# Patient Record
Sex: Female | Born: 1935 | Race: Black or African American | Hispanic: No | Smoking: Former smoker
Health system: Southern US, Community
[De-identification: ages and names within clinical notes are randomized; demographics above are authoritative.]

## PROBLEM LIST (undated history)

## (undated) DIAGNOSIS — C50919 Malignant neoplasm of unspecified site of unspecified female breast: Secondary | ICD-10-CM

## (undated) DIAGNOSIS — I82409 Acute embolism and thrombosis of unspecified deep veins of unspecified lower extremity: Secondary | ICD-10-CM

## (undated) DIAGNOSIS — I639 Cerebral infarction, unspecified: Secondary | ICD-10-CM

## (undated) DIAGNOSIS — Z5111 Encounter for antineoplastic chemotherapy: Secondary | ICD-10-CM

## (undated) HISTORY — PX: APPENDECTOMY: SHX54

---

## 2015-06-20 DIAGNOSIS — C50411 Malignant neoplasm of upper-outer quadrant of right female breast: Secondary | ICD-10-CM | POA: Insufficient documentation

## 2019-12-05 ENCOUNTER — Encounter (HOSPITAL_BASED_OUTPATIENT_CLINIC_OR_DEPARTMENT_OTHER): Payer: Self-pay | Admitting: Emergency Medicine

## 2019-12-05 ENCOUNTER — Other Ambulatory Visit: Payer: Self-pay

## 2019-12-05 ENCOUNTER — Emergency Department (HOSPITAL_BASED_OUTPATIENT_CLINIC_OR_DEPARTMENT_OTHER)
Admission: EM | Admit: 2019-12-05 | Discharge: 2019-12-05 | Disposition: A | Discharge status: Home / Self Care | Payer: Medicare Other | Attending: Emergency Medicine | Admitting: Emergency Medicine

## 2019-12-05 ENCOUNTER — Emergency Department (HOSPITAL_BASED_OUTPATIENT_CLINIC_OR_DEPARTMENT_OTHER): Payer: Medicare Other

## 2019-12-05 DIAGNOSIS — Z853 Personal history of malignant neoplasm of breast: Secondary | ICD-10-CM | POA: Insufficient documentation

## 2019-12-05 DIAGNOSIS — R55 Syncope and collapse: Secondary | ICD-10-CM | POA: Insufficient documentation

## 2019-12-05 HISTORY — DX: Encounter for antineoplastic chemotherapy: Z51.11

## 2019-12-05 HISTORY — DX: Malignant neoplasm of unspecified site of unspecified female breast: C50.919

## 2019-12-05 LAB — URINALYSIS, ROUTINE W REFLEX MICROSCOPIC
Bilirubin Urine: NEGATIVE
Glucose, UA: NEGATIVE mg/dL
Ketones, ur: NEGATIVE mg/dL
Leukocytes,Ua: NEGATIVE
Nitrite: NEGATIVE
Protein, ur: NEGATIVE mg/dL
Specific Gravity, Urine: 1.02 (ref 1.005–1.030)
pH: 6 (ref 5.0–8.0)

## 2019-12-05 LAB — BASIC METABOLIC PANEL
Anion gap: 8 (ref 5–15)
BUN: 13 mg/dL (ref 8–23)
CO2: 27 mmol/L (ref 22–32)
Calcium: 8.8 mg/dL — ABNORMAL LOW (ref 8.9–10.3)
Chloride: 105 mmol/L (ref 98–111)
Creatinine, Ser: 1.17 mg/dL — ABNORMAL HIGH (ref 0.44–1.00)
GFR calc Af Amer: 51 mL/min — ABNORMAL LOW (ref >60)
GFR calc non Af Amer: 44 mL/min — ABNORMAL LOW (ref >60)
Glucose, Bld: 133 mg/dL — ABNORMAL HIGH (ref 70–99)
Potassium: 3.8 mmol/L (ref 3.5–5.1)
Sodium: 140 mmol/L (ref 135–145)

## 2019-12-05 LAB — CBG MONITORING, ED: Glucose-Capillary: 124 mg/dL — ABNORMAL HIGH (ref 70–99)

## 2019-12-05 LAB — CBC
HCT: 41.2 % (ref 36.0–46.0)
Hemoglobin: 12.9 g/dL (ref 12.0–15.0)
MCH: 25.8 pg — ABNORMAL LOW (ref 26.0–34.0)
MCHC: 31.3 g/dL (ref 30.0–36.0)
MCV: 82.4 fL (ref 80.0–100.0)
Platelets: 283 10*3/uL (ref 150–400)
RBC: 5 MIL/uL (ref 3.87–5.11)
RDW: 13.4 % (ref 11.5–15.5)
WBC: 5.4 10*3/uL (ref 4.0–10.5)
nRBC: 0 % (ref 0.0–0.2)

## 2019-12-05 LAB — URINALYSIS, MICROSCOPIC (REFLEX)

## 2019-12-05 MED ORDER — SODIUM CHLORIDE 0.9 % IV BOLUS
1000.0000 mL | Freq: Once | INTRAVENOUS | Status: AC
  Administered 2019-12-05: 1000 mL via INTRAVENOUS

## 2019-12-05 MED ORDER — SODIUM CHLORIDE 0.9% FLUSH
3.0000 mL | Freq: Once | INTRAVENOUS | Status: DC
  Filled 2019-12-05: qty 3

## 2019-12-05 NOTE — Discharge Instructions (Signed)
Recommend scheduling a follow-up appointment with your primary doctor to discuss the symptoms you are experiencing today.  If you have any further episodes of passing out, any chest pain or difficulty in breathing, please return to ER for reassessment.

## 2019-12-05 NOTE — ED Triage Notes (Signed)
Pt c/o B/L hip, knee and back pain ongoing for awhile. Today pt took 1 oxycodone afterwards she was cooking in the kitchen and had a syncopal episode.

## 2019-12-05 NOTE — ED Provider Notes (Signed)
[place] EMERGENCY DEPARTMENT Provider Note   CSN: [id number] Arrival date & time: 12/05/19  1426     History Chief Complaint  Patient presents with  . Loss of Consciousness    Cynthia Ho is a 83 y.o. female.  Presents from the department after syncopal episode.  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  States she has not eaten or drink anything today and while she was in the kitchen cooking, felt lightheaded, then passed out.  States she landed on her left knee, denies any head trauma, no neck, back pain.  She has not had any associated chest pain, difficulty breathing, abdominal pain.  States she had full quick return consciousness, no bladder bowel incontinence, no shaking.    HPI     Past Medical History:  Diagnosis Date  . Admission for chemotherapy   . Breast cancer ([place])     There are no problems to display for this patient.   Past Surgical History:  Procedure Laterality Date  . APPENDECTOMY       OB History   No obstetric history on file.     No family history on file.  Social History   Tobacco Use  . Smoking status: Never Smoker  . Smokeless tobacco: Never Used  Substance Use Topics  . Alcohol use: Never  . Drug use: Never    Home Medications Prior to Admission medications   Not on File    Allergies    Patient has no known allergies.  Review of Systems   Review of Systems  Constitutional: Negative for chills and fever.  HENT: Negative for ear pain and sore throat.   Eyes: Negative for pain and visual disturbance.  Respiratory: Negative for cough and shortness of breath.   Cardiovascular: Negative for chest pain and palpitations.  Gastrointestinal: Negative for abdominal pain and vomiting.  Genitourinary: Negative for dysuria and  hematuria.  Musculoskeletal: Negative for arthralgias and back pain.  Skin: Negative for color change and rash.  Neurological: Positive for syncope. Negative for seizures.  All other systems reviewed and are negative.   Physical Exam Updated Vital Signs BP (!) 107/55   Pulse (!) 54   Temp 98.7 F (37.1 C) (Oral)   Resp 13   Ht 5\' 5"  (1.651 m)   Wt 77.1 kg   SpO2 98%   BMI 28.29 kg/m   Physical Exam Vitals and nursing note reviewed.  Constitutional:      General: She is not in acute distress.    Appearance: She is well-developed.  HENT:     Head: Normocephalic and atraumatic.  Eyes:     Conjunctiva/sclera: Conjunctivae normal.  Cardiovascular:     Rate and Rhythm: Normal rate and regular rhythm.     Heart sounds: No murmur.  Pulmonary:     Effort: Pulmonary effort is normal. No respiratory distress.     Breath sounds: Normal breath sounds.  Abdominal:     Palpations: Abdomen is soft.     Tenderness: There is no abdominal tenderness.  Musculoskeletal:        General: No swelling, tenderness or deformity.     Cervical back: Neck supple.     Comments: No C, T, L spine TTP No TTP throughout extremities, normal joint ROM  Skin:  General: Skin is warm and dry.     Capillary Refill: Capillary refill takes less than 2 seconds.  Neurological:     General: No focal deficit present.     Mental Status: She is alert and oriented to person, place, and time.  Psychiatric:        Mood and Affect: Mood normal.        Behavior: Behavior normal.     ED Results / Procedures / Treatments   Labs (all labs ordered are listed, but only abnormal results are displayed) Labs Reviewed  BASIC METABOLIC PANEL - Abnormal; Notable for the following components:      Result Value   Glucose, Bld 133 (*)    Creatinine, Ser 1.17 (*)    Calcium 8.8 (*)    GFR calc non Af Amer 44 (*)    GFR calc Af Amer 51 (*)    All other components within normal limits  CBC - Abnormal; Notable for the  following components:   MCH 25.8 (*)    All other components within normal limits  URINALYSIS, ROUTINE W REFLEX MICROSCOPIC - Abnormal; Notable for the following components:   Hgb urine dipstick TRACE (*)    All other components within normal limits  URINALYSIS, MICROSCOPIC (REFLEX) - Abnormal; Notable for the following components:   Bacteria, UA RARE (*)    All other components within normal limits  CBG MONITORING, ED - Abnormal; Notable for the following components:   Glucose-Capillary 124 (*)    All other components within normal limits    EKG EKG Interpretation  Date/Time:  Sunday December 05 2019 14:40:16 EST Ventricular Rate:  64 PR Interval:  144 QRS Duration: 100 QT Interval:  458 QTC Calculation: 472 R Axis:   30 Text Interpretation: Normal sinus rhythm Minimal voltage criteria for LVH, may be normal variant ( Cornell product ) Borderline ECG Confirmed by ,  ([id number]) on 12/05/2019 3:11:52 PM   Radiology DG Chest 2 View  Result Date: 12/05/2019 CLINICAL DATA:  Former smoker; no h/o lung problems; pt had syncopal episode after taking an oxycodone EXAM: CHEST - 2 VIEW COMPARISON:  None. FINDINGS: Cardiac silhouette is normal in size. No mediastinal or hilar masses. No evidence of adenopathy. Lungs are hyperexpanded but clear. No pleural effusion or pneumothorax. Skeletal structures are demineralized but intact. IMPRESSION: No acute cardiopulmonary disease. Electronically Signed   By: David  Ormond M.D.   On: 12/05/2019 16:12    Procedures Procedures (including critical care time)  Medications Ordered in ED Medications  sodium chloride 0.9 % bolus 1,000 mL (0 mLs Intravenous Stopped 12/05/19 1659)    ED Course  I have reviewed the triage vital signs and the nursing notes.  Pertinent labs & imaging results that were available during my care of the patient were reviewed by me and considered in my medical decision making (see chart for details).    MDM  Rules/Calculators/A&P                       83  year old lady presented to ER after syncopal episode.  Here noted to be well-appearing, unremarkable physical exam.  No associated trauma.  Had some preceding lightheadedness.  No report of seizure-like activity, no bladder bowel incontinence.  No preceding chest pain or dyspnea.  She has no ongoing complaints at that EKG without acute ischemic changes, CBC, BMP within normal limits.  Suspect most likely etiology vasovagal, also related to increase in her narcotic medication, dehydration.  Reviewed precautions, recommend recheck with PCP this coming week.  Will discharge home.    After the discussed management above, the patient was determined to be safe for discharge.  The patient was in agreement with this plan and all questions regarding their care were answered.  ED return precautions were discussed and the patient will return to the ED with any significant worsening of condition.   Final Clinical Impression(s) / ED Diagnoses Final diagnoses:  Syncope, unspecified syncope type    Rx / DC Orders ED Discharge Orders    None       Lucrezia Starch, MD 12/06/19 1341

## 2019-12-05 NOTE — ED Notes (Signed)
Pt on monitor 

## 2020-01-12 DIAGNOSIS — I2699 Other pulmonary embolism without acute cor pulmonale: Secondary | ICD-10-CM | POA: Insufficient documentation

## 2020-10-27 DIAGNOSIS — I6523 Occlusion and stenosis of bilateral carotid arteries: Secondary | ICD-10-CM | POA: Insufficient documentation

## 2020-10-27 DIAGNOSIS — I739 Peripheral vascular disease, unspecified: Secondary | ICD-10-CM | POA: Insufficient documentation

## 2021-09-20 DIAGNOSIS — F0390 Unspecified dementia without behavioral disturbance: Secondary | ICD-10-CM | POA: Insufficient documentation

## 2022-05-04 ENCOUNTER — Other Ambulatory Visit: Payer: Self-pay

## 2022-05-04 ENCOUNTER — Emergency Department (HOSPITAL_BASED_OUTPATIENT_CLINIC_OR_DEPARTMENT_OTHER): Payer: Medicare Other

## 2022-05-04 ENCOUNTER — Emergency Department (HOSPITAL_BASED_OUTPATIENT_CLINIC_OR_DEPARTMENT_OTHER)
Admission: EM | Admit: 2022-05-04 | Discharge: 2022-05-04 | Disposition: A | Discharge status: Home / Self Care | Payer: Medicare Other | Attending: Emergency Medicine | Admitting: Emergency Medicine

## 2022-05-04 ENCOUNTER — Encounter (HOSPITAL_BASED_OUTPATIENT_CLINIC_OR_DEPARTMENT_OTHER): Payer: Self-pay | Admitting: Emergency Medicine

## 2022-05-04 DIAGNOSIS — R0601 Orthopnea: Secondary | ICD-10-CM | POA: Diagnosis not present

## 2022-05-04 DIAGNOSIS — R059 Cough, unspecified: Secondary | ICD-10-CM | POA: Diagnosis not present

## 2022-05-04 DIAGNOSIS — D649 Anemia, unspecified: Secondary | ICD-10-CM | POA: Diagnosis not present

## 2022-05-04 DIAGNOSIS — Z853 Personal history of malignant neoplasm of breast: Secondary | ICD-10-CM | POA: Diagnosis not present

## 2022-05-04 DIAGNOSIS — I10 Essential (primary) hypertension: Secondary | ICD-10-CM | POA: Insufficient documentation

## 2022-05-04 DIAGNOSIS — Z20822 Contact with and (suspected) exposure to covid-19: Secondary | ICD-10-CM | POA: Insufficient documentation

## 2022-05-04 DIAGNOSIS — R600 Localized edema: Secondary | ICD-10-CM | POA: Diagnosis not present

## 2022-05-04 DIAGNOSIS — M7989 Other specified soft tissue disorders: Secondary | ICD-10-CM | POA: Diagnosis present

## 2022-05-04 HISTORY — DX: Cerebral infarction, unspecified: I63.9

## 2022-05-04 HISTORY — DX: Acute embolism and thrombosis of unspecified deep veins of unspecified lower extremity: I82.409

## 2022-05-04 LAB — CBC WITH DIFFERENTIAL/PLATELET
Abs Immature Granulocytes: 0.01 10*3/uL (ref 0.00–0.07)
Basophils Absolute: 0 10*3/uL (ref 0.0–0.1)
Basophils Relative: 1 %
Eosinophils Absolute: 0.2 10*3/uL (ref 0.0–0.5)
Eosinophils Relative: 4 %
HCT: 32 % — ABNORMAL LOW (ref 36.0–46.0)
Hemoglobin: 9.2 g/dL — ABNORMAL LOW (ref 12.0–15.0)
Immature Granulocytes: 0 %
Lymphocytes Relative: 29 %
Lymphs Abs: 1.5 10*3/uL (ref 0.7–4.0)
MCH: 19.4 pg — ABNORMAL LOW (ref 26.0–34.0)
MCHC: 28.8 g/dL — ABNORMAL LOW (ref 30.0–36.0)
MCV: 67.4 fL — ABNORMAL LOW (ref 80.0–100.0)
Monocytes Absolute: 0.8 10*3/uL (ref 0.1–1.0)
Monocytes Relative: 15 %
Neutro Abs: 2.7 10*3/uL (ref 1.7–7.7)
Neutrophils Relative %: 51 %
Platelets: 381 10*3/uL (ref 150–400)
RBC: 4.75 MIL/uL (ref 3.87–5.11)
RDW: 19.5 % — ABNORMAL HIGH (ref 11.5–15.5)
Smear Review: NORMAL
WBC: 5.3 10*3/uL (ref 4.0–10.5)
nRBC: 0 % (ref 0.0–0.2)

## 2022-05-04 LAB — RESP PANEL BY RT-PCR (FLU A&B, COVID) ARPGX2
Influenza A by PCR: NEGATIVE
Influenza B by PCR: NEGATIVE
SARS Coronavirus 2 by RT PCR: NEGATIVE

## 2022-05-04 LAB — COMPREHENSIVE METABOLIC PANEL
ALT: 12 U/L (ref 0–44)
AST: 16 U/L (ref 15–41)
Albumin: 3.2 g/dL — ABNORMAL LOW (ref 3.5–5.0)
Alkaline Phosphatase: 63 U/L (ref 38–126)
Anion gap: 8 (ref 5–15)
BUN: 18 mg/dL (ref 8–23)
CO2: 27 mmol/L (ref 22–32)
Calcium: 8.8 mg/dL — ABNORMAL LOW (ref 8.9–10.3)
Chloride: 105 mmol/L (ref 98–111)
Creatinine, Ser: 0.91 mg/dL (ref 0.44–1.00)
GFR, Estimated: >60 mL/min (ref >60)
Glucose, Bld: 111 mg/dL — ABNORMAL HIGH (ref 70–99)
Potassium: 3.5 mmol/L (ref 3.5–5.1)
Sodium: 140 mmol/L (ref 135–145)
Total Bilirubin: 0.6 mg/dL (ref 0.3–1.2)
Total Protein: 6.1 g/dL — ABNORMAL LOW (ref 6.5–8.1)

## 2022-05-04 LAB — TROPONIN I (HIGH SENSITIVITY): Troponin I (High Sensitivity): 11 ng/L (ref <18)

## 2022-05-04 LAB — BRAIN NATRIURETIC PEPTIDE: B Natriuretic Peptide: 42 pg/mL (ref 0.0–100.0)

## 2022-05-04 LAB — D-DIMER, QUANTITATIVE: D-Dimer, Quant: 0.34 ug/mL-FEU (ref 0.00–0.50)

## 2022-05-04 NOTE — ED Provider Notes (Addendum)
?[place] EMERGENCY DEPARTMENT ?Provider Note ? ? ?CSN: [id number] ?Arrival date & time: 05/04/22  0715 ? ?  ? ?History ? ?Chief Complaint  ?Patient presents with  ?? Leg Swelling  ? ? ?Cynthia Ho is a 86 y.o. female. ? ?HPI ? ?  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 ? ?The patient states that she has been taking her Eliquis.  She states that over the last 2 weeks she has noted increasing bilateral lower extremity edema, left greater than right.  She was started on Lasix and has been taking the medication but feels that her symptoms have not improved.  She endorses some dyspnea on exertion, orthopnea, paroxysmal nocturnal dyspnea.  She denies any chest pain. She has had a dry cough. No fevers or chills. No melena or hematochezia. ? ?Home Medications ?Prior to Admission medications   ?Not on File  ?   ? ?Allergies    ?Patient has no known allergies.   ? ?Review of Systems   ?Review of Systems  ?All other systems reviewed and are negative. ? ?Physical Exam ?Updated Vital Signs ?BP 130/61 (BP Location: Right Arm)   Pulse 72   Temp 97.7 ?F (36.5 ?C) (Oral)   Resp 16   Ht '5\' 5"'$  (1.651 m)   Wt 68 kg   SpO2 100%   BMI 24.96 kg/m?  ?Physical Exam ?Vitals and nursing note reviewed.  ?Constitutional:   ?   General: She is not in acute distress. ?   Appearance: She is well-developed.  ?HENT:  ?   Head: Normocephalic and atraumatic.  ?Eyes:  ?   Conjunctiva/sclera: Conjunctivae normal.  ?Cardiovascular:  ?   Rate and Rhythm: Normal rate and regular rhythm.  ?   Heart sounds: No murmur heard. ?Pulmonary:  ?   Effort: Pulmonary  effort is normal. No respiratory distress.  ?   Breath sounds: Normal breath sounds.  ?Abdominal:  ?   Palpations: Abdomen is soft.  ?   Tenderness: There is no abdominal tenderness.  ?Musculoskeletal:  ?   Cervical back: Neck supple.  ?   Comments: Trace bilateral pitting edema, left greater than right  ?Skin: ?   General: Skin is warm and dry.  ?   Capillary Refill: Capillary refill takes less than 2 seconds.  ?Neurological:  ?   Mental Status: She is alert.  ?Psychiatric:     ?   Mood and Affect: Mood normal.  ? ? ?ED Results / Procedures / Treatments   ?Labs ?(all labs ordered are listed, but only abnormal results are displayed) ?Labs Reviewed  ?COMPREHENSIVE METABOLIC PANEL - Abnormal; Notable for the following components:  ?    Result Value  ? Glucose, Bld 111 (*)   ? Calcium 8.8 (*)   ? Total Protein 6.1 (*)   ? Albumin 3.2 (*)   ? All other components within normal limits  ?CBC WITH DIFFERENTIAL/PLATELET - Abnormal; Notable for the following components:  ? Hemoglobin 9.2 (*)   ? HCT 32.0 (*)   ? MCV 67.4 (*)   ? MCH 19.4 (*)   ? MCHC 28.8 (*)   ? RDW 19.5 (*)   ?  All other components within normal limits  ?RESP PANEL BY RT-PCR (FLU A&B, COVID) ARPGX2  ?D-DIMER, QUANTITATIVE  ?BRAIN NATRIURETIC PEPTIDE  ?TROPONIN I (HIGH SENSITIVITY)  ? ? ?EKG ?EKG Interpretation ? ?Date/Time:  Saturday May 04 2022 07:55:12 EDT ?Ventricular Rate:  78 ?PR Interval:  157 ?QRS Duration: 113 ?QT Interval:  438 ?QTC Calculation: 499 ?R Axis:   17 ?Text Interpretation: Sinus arrhythmia Probable anteroseptal infarct, old Confirmed by Regan Lemming ([id number]) on 05/04/2022 8:05:27 AM ? ?Radiology ?US Venous Img Lower Bilateral (DVT) ? ?Result Date: 05/04/2022 ?CLINICAL DATA:  Bilateral leg swelling for 2 weeks. EXAM: BILATERAL LOWER EXTREMITY VENOUS DOPPLER ULTRASOUND TECHNIQUE: Gray-scale sonography with compression, as well as color and duplex ultrasound, were performed to evaluate the deep venous system(s) from the level of the common  femoral vein through the popliteal and proximal calf veins. COMPARISON:  None Available. FINDINGS: VENOUS Normal compressibility of the common femoral, superficial femoral, and popliteal veins, as well as the visualized calf veins. Visualized portions of profunda femoral vein and great saphenous vein unremarkable. No filling defects to suggest DVT on grayscale or color Doppler imaging. Doppler waveforms show normal direction of venous flow, normal respiratory plasticity and response to augmentation. Limited views of the contralateral common femoral vein are unremarkable. OTHER None. Limitations: none IMPRESSION: Negative. Electronically Signed   By: Kerby Moors M.D.   On: 05/04/2022 09:39  ? ?DG Chest Port 1 View ? ?Result Date: 05/04/2022 ?CLINICAL DATA:  Shortness of breath EXAM: PORTABLE CHEST 1 VIEW COMPARISON:  12/05/2019 FINDINGS: Eventration of the right diaphragm. There is no edema, consolidation, effusion, or pneumothorax. Normal heart size and mediastinal contours. IMPRESSION: No active disease. Electronically Signed   By: Jorje Guild M.D.   On: 05/04/2022 08:13   ? ?Procedures ?Procedures  ? ? ?Medications Ordered in ED ?Medications - No data to display ? ?ED Course/ Medical Decision Making/ A&P ?Clinical Course as of 05/04/22 1216  ?Sat May 04, 2022  ?1215 MCV(!): 67.4 [JL]  ?1215 Hemoglobin(!): 9.2 [JL]  ?  ?Clinical Course User Index ?[JL] Regan Lemming, MD  ? ?                        ?Medical Decision Making ?Amount and/or Complexity of Data Reviewed ?Labs: ordered. ?Radiology: ordered. ? ? ?  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 ? ?The patient states that she has been taking her Eliquis.  She states that over the last 2 weeks she has noted increasing bilateral lower extremity edema, left greater than right.  She was started on Lasix and has been taking the medication but feels that her symptoms have not improved.  She endorses some dyspnea on exertion, orthopnea, paroxysmal nocturnal dyspnea.  She denies any chest pain. She has had a dry cough. No fevers or chills. No melena or hematochezia. ? ? ?On arrival, the patient was afebrile, hemodynamically stable, mildly hypertensive BP 141/78, saturating 100% on room air.  Sinus rhythm noted on cardiac telemetry.  Patient presenting with shortness of breath and bilateral lower extremity swelling despite outpatient Lasix use with her PCP.  She has had  a dry cough. ? ?Differential diagnosis includes PE, COVID-19, viral URI, CHF, DVT, PNA, anemia. ? ?EKG significant for sinus arrhythmia, ventricular rate 78, PR interval 157, QRS 113, QTc 499, no acute ST segment changes to indicate ischemia. ? ?Chest x-ray was performed and was interpreted by myself and radiology and revealed no focal cardiac or pulmonary abnormality.  No evidence of pulmonary edema.  Laboratory work-up significant for COVID-19 and influenza PCR testing negative, BNP negative, troponin negative, CBC without a leukocytosis, new anemia compared to measurements over 2 years ago to 9.2 from a hemoglobin of greater than 12.  Doubt acute GI bleed at this time given lack of symptoms.  Patient could be experiencing mild symptomatic anemia with a hemoglobin of 9.2 but no indication for blood transfusion or admission at this time.  CMP was without significant electrolyte abnormality, decreased total protein and albumin with resultant mild hypercalcemia with normal calcium after correction for hypoalbuminemia.  Normal LFTs.  A D-dimer was also collected and resulted negative. ? ?DVT ultrasound imaging was performed and was  negative for acute abnormality with no evidence for DVT.  Patient has trace pitting edema.  She is already been prescribed Lasix and has been taking the medication.  With a negative D-dimer, low concern for acute PE.

## 2022-05-04 NOTE — ED Triage Notes (Signed)
Per family pt has had blateral leg swelling x 2 weeks , saw heer dr and was given lasix but her feet are till swelling , have been elevating them but no better ?

## 2022-05-04 NOTE — Discharge Instructions (Addendum)
Your laboratory work-up, EKG and chest x-ray imaging was reassuring.  Your ultrasound was also negative.  Make sure to wear compression stockings bilaterally, continue to take your prescribed lasix, continue to take your Eliquis, and follow up with your PCP.  ?

## 2022-07-14 ENCOUNTER — Emergency Department (HOSPITAL_BASED_OUTPATIENT_CLINIC_OR_DEPARTMENT_OTHER)
Admission: EM | Admit: 2022-07-14 | Discharge: 2022-07-14 | Disposition: A | Discharge status: Home / Self Care | Payer: Medicare Other | Attending: Emergency Medicine | Admitting: Emergency Medicine

## 2022-07-14 ENCOUNTER — Encounter (HOSPITAL_BASED_OUTPATIENT_CLINIC_OR_DEPARTMENT_OTHER): Payer: Self-pay | Admitting: Emergency Medicine

## 2022-07-14 ENCOUNTER — Telehealth: Payer: Self-pay | Admitting: Surgery

## 2022-07-14 ENCOUNTER — Emergency Department (HOSPITAL_BASED_OUTPATIENT_CLINIC_OR_DEPARTMENT_OTHER): Payer: Medicare Other

## 2022-07-14 DIAGNOSIS — W1839XA Other fall on same level, initial encounter: Secondary | ICD-10-CM | POA: Insufficient documentation

## 2022-07-14 DIAGNOSIS — N3000 Acute cystitis without hematuria: Secondary | ICD-10-CM

## 2022-07-14 DIAGNOSIS — D649 Anemia, unspecified: Secondary | ICD-10-CM | POA: Diagnosis not present

## 2022-07-14 DIAGNOSIS — M545 Low back pain, unspecified: Secondary | ICD-10-CM | POA: Diagnosis present

## 2022-07-14 DIAGNOSIS — W19XXXA Unspecified fall, initial encounter: Secondary | ICD-10-CM

## 2022-07-14 LAB — BASIC METABOLIC PANEL
Anion gap: 8 (ref 5–15)
BUN: 17 mg/dL (ref 8–23)
CO2: 27 mmol/L (ref 22–32)
Calcium: 8.8 mg/dL — ABNORMAL LOW (ref 8.9–10.3)
Chloride: 104 mmol/L (ref 98–111)
Creatinine, Ser: 1.09 mg/dL — ABNORMAL HIGH (ref 0.44–1.00)
GFR, Estimated: 50 mL/min — ABNORMAL LOW (ref >60)
Glucose, Bld: 116 mg/dL — ABNORMAL HIGH (ref 70–99)
Potassium: 3.7 mmol/L (ref 3.5–5.1)
Sodium: 139 mmol/L (ref 135–145)

## 2022-07-14 LAB — URINALYSIS, MICROSCOPIC (REFLEX): WBC, UA: >50 WBC/hpf (ref 0–5)

## 2022-07-14 LAB — CBC WITH DIFFERENTIAL/PLATELET
Abs Immature Granulocytes: 0.02 10*3/uL (ref 0.00–0.07)
Basophils Absolute: 0 10*3/uL (ref 0.0–0.1)
Basophils Relative: 0 %
Eosinophils Absolute: 0.1 10*3/uL (ref 0.0–0.5)
Eosinophils Relative: 1 %
HCT: 32.6 % — ABNORMAL LOW (ref 36.0–46.0)
Hemoglobin: 9.6 g/dL — ABNORMAL LOW (ref 12.0–15.0)
Immature Granulocytes: 0 %
Lymphocytes Relative: 8 %
Lymphs Abs: 0.5 10*3/uL — ABNORMAL LOW (ref 0.7–4.0)
MCH: 19.7 pg — ABNORMAL LOW (ref 26.0–34.0)
MCHC: 29.4 g/dL — ABNORMAL LOW (ref 30.0–36.0)
MCV: 66.8 fL — ABNORMAL LOW (ref 80.0–100.0)
Monocytes Absolute: 0.4 10*3/uL (ref 0.1–1.0)
Monocytes Relative: 5 %
Neutro Abs: 5.9 10*3/uL (ref 1.7–7.7)
Neutrophils Relative %: 86 %
Platelets: 471 10*3/uL — ABNORMAL HIGH (ref 150–400)
RBC: 4.88 MIL/uL (ref 3.87–5.11)
RDW: 19.9 % — ABNORMAL HIGH (ref 11.5–15.5)
WBC: 6.9 10*3/uL (ref 4.0–10.5)
nRBC: 0 % (ref 0.0–0.2)

## 2022-07-14 LAB — URINALYSIS, ROUTINE W REFLEX MICROSCOPIC
Bilirubin Urine: NEGATIVE
Glucose, UA: NEGATIVE mg/dL
Ketones, ur: NEGATIVE mg/dL
Nitrite: POSITIVE — AB
Protein, ur: NEGATIVE mg/dL
Specific Gravity, Urine: 1.02 (ref 1.005–1.030)
pH: 6 (ref 5.0–8.0)

## 2022-07-14 MED ORDER — OXYCODONE-ACETAMINOPHEN 5-325 MG PO TABS
1.0000 | ORAL_TABLET | Freq: Once | ORAL | Status: AC
  Administered 2022-07-14: 1 via ORAL
  Filled 2022-07-14: qty 1

## 2022-07-14 MED ORDER — NITROFURANTOIN MONOHYD MACRO 100 MG PO CAPS: 100.0000 mg | ORAL_CAPSULE | Freq: Two times a day (BID) | ORAL | 0 refills | Status: AC

## 2022-07-14 MED ORDER — METHOCARBAMOL 500 MG PO TABS
500.0000 mg | ORAL_TABLET | Freq: Once | ORAL | Status: AC
  Administered 2022-07-14: 500 mg via ORAL
  Filled 2022-07-14: qty 1

## 2022-07-14 MED ORDER — DICLOFENAC SODIUM 1 % EX GEL: 4.0000 g | Freq: Four times a day (QID) | CUTANEOUS | 0 refills | Status: AC | PRN

## 2022-07-14 NOTE — ED Provider Notes (Signed)
[place] EMERGENCY DEPARTMENT Provider Note   CSN: [id number] Arrival date & time: 07/14/22  1620     History PMHx significant for hx of DVT on eliquis Chief Complaint  Patient presents with   Fall   Back Pain    Cynthia Ho is a 86 y.o. female.  Pt states she was going to the bath room around 5:30 am and fell backwards. Did not trip, just felt shaky which is not unusual for her. She has a walker but was not using it. She doesn't think she hit her head and denies LOC, light headedness. She states she fell on her back. Since she fell she has had pain in her lower back, worse with movement. She took Tylenol this morning which did not help. She takes Eliquis d/t hx of blood clots.    Fall Associated symptoms include shortness of breath. Pertinent negatives include no chest pain and no abdominal pain.  Back Pain Associated symptoms: no abdominal pain, no chest pain and no dysuria        Home Medications Prior to Admission medications   Medication Sig Start Date End Date Taking? Authorizing Provider  diclofenac Sodium (VOLTAREN) 1 % GEL Apply 4 g topically 4 (four) times daily as needed. 07/14/22  Yes Precious Gilding, DO  nitrofurantoin, macrocrystal-monohydrate, (MACROBID) 100 MG capsule Take 1 capsule (100 mg total) by mouth 2 (two) times daily for 7 days. 07/14/22 07/21/22 Yes Precious Gilding, DO      Allergies    Patient has no known allergies.    Review of Systems   Review of Systems  Constitutional:  Negative for fatigue.  Respiratory:  Positive for shortness of breath.   Cardiovascular:  Negative for chest pain.  Gastrointestinal:  Positive for nausea. Negative for abdominal pain, blood in stool and vomiting.  Genitourinary:  Positive for frequency. Negative for dysuria.  Musculoskeletal:  Positive for back pain.  Neurological:  Negative for dizziness and light-headedness.    Physical Exam Updated Vital Signs BP (!) 155/70   Pulse 60   Temp 97.8 F  (36.6 C) (Oral)   Resp 16   Ht '5\' 5"'$  (1.651 m)   Wt 68 kg   SpO2 95%   BMI 24.96 kg/m  Physical Exam Constitutional:      General: She is not in acute distress.    Appearance: She is not ill-appearing.  Eyes:     Conjunctiva/sclera: Conjunctivae normal.  Cardiovascular:     Rate and Rhythm: Normal rate and regular rhythm.  Pulmonary:     Effort: Pulmonary effort is normal.     Breath sounds: Normal breath sounds.  Abdominal:     General: Abdomen is flat. Bowel sounds are normal. There is no distension.     Palpations: Abdomen is soft.  Musculoskeletal:     Thoracic back: No swelling or tenderness.     Lumbar back: Tenderness present. No swelling.     Comments: Tenderness at L4-L5  Skin:    General: Skin is warm and dry.  Neurological:     General: No focal deficit present.     Mental Status: She is alert.  Psychiatric:        Mood and Affect: Mood normal.        Behavior: Behavior normal.     ED Results / Procedures / Treatments   Labs (all labs ordered are listed, but only abnormal results are displayed) Labs Reviewed  CBC WITH DIFFERENTIAL/PLATELET - Abnormal; Notable for the following components:  Result Value   Hemoglobin 9.6 (*)    HCT 32.6 (*)    MCV 66.8 (*)    MCH 19.7 (*)    MCHC 29.4 (*)    RDW 19.9 (*)    Platelets 471 (*)    Lymphs Abs 0.5 (*)    All other components within normal limits  BASIC METABOLIC PANEL - Abnormal; Notable for the following components:   Glucose, Bld 116 (*)    Creatinine, Ser 1.09 (*)    Calcium 8.8 (*)    GFR, Estimated 50 (*)    All other components within normal limits  URINALYSIS, ROUTINE W REFLEX MICROSCOPIC - Abnormal; Notable for the following components:   APPearance CLOUDY (*)    Hgb urine dipstick SMALL (*)    Nitrite POSITIVE (*)    Leukocytes,Ua LARGE (*)    All other components within normal limits  URINALYSIS, MICROSCOPIC (REFLEX) - Abnormal; Notable for the following components:   Bacteria, UA  MANY (*)    All other components within normal limits    EKG None  Radiology DG Lumbar Spine Complete  Result Date: 07/14/2022 CLINICAL DATA:  Lower back pain after fall. EXAM: LUMBAR SPINE - COMPLETE 4+ VIEW COMPARISON:  Lumbar spine radiographs 05/31/2019; CT chest 05/28/2022 FINDINGS: There are 5 non-rib-bearing lumbar-type vertebral bodies. Minimal levocurvature centered at L3-4. 4 mm grade 1 anterolisthesis of L4 on L5. Minimal superior L2 endplate concavity is mildly worsened from 05/31/2019 lumbar spine radiographs. Unfortunately the available 05/28/2022 CT only minimally images a portion of the superior L2 vertebral endplate. This compression fracture remains age indeterminate. Mild anterior L1-2 endplate osteophytes. Minimal posterior L5-S1 disc space narrowing. Moderate atherosclerotic calcifications within the aorta and iliac arteries. IMPRESSION: 1. Mild superior L2 endplate compression fracture is mildly worsened from 05/31/2019. No more recent imaging is available to further assess acuity. This remains age indeterminate. Recommend clinical correlation for point tenderness. 2. Mild grade 1 anterolisthesis of L4 on L5 is unchanged. Electronically Signed   By: Yvonne Kendall M.D.   On: 07/14/2022 17:40    Procedures none  Medications Ordered in ED Medications  oxyCODONE-acetaminophen (PERCOCET/ROXICET) 5-325 MG per tablet 1 tablet (1 tablet Oral Given 07/14/22 1721)  methocarbamol (ROBAXIN) tablet 500 mg (500 mg Oral Given 07/14/22 1926)    ED Course/ Medical Decision Making/ A&P                           Medical Decision Making 86 y.o. female presents after fall onto back likely d/t not using her walker. Did not hit head. VSS. She has pain with palpation of lumbar spine. Lumbar X ray shows age-indeterminate compression fracture of L2 which is likely unrelated to the pain she is currently having as her pain is in the L4-L5 region. She was given Percocet 5-325 mg and Robaxin 500 mg  once for pain.  CBC, CMP were obtained to rule out other causes of her fall including worsening anemia, electrolyte imbalances, metabolic disturbances which were unremarkable.  CBC did show microcytic anemia but this appears to be chronic and stable.  UA did show large leuks, nitrites, bacteria.  Considering this with patient's increased urinary frequency, a prescription Macrobid was sent to her pharmacy.  For pain, a prescription of Voltaren gel was sent to pharmacy and she was also advised to use over-the-counter Tylenol as needed.  There is no indication for admission and patient was discharged in stable condition with instructions to follow-up  with her primary care doctor within 1 week of discharge.  Amount and/or Complexity of Data Reviewed Labs: ordered. Radiology: ordered.  Risk Prescription drug management.  None  Final Clinical Impression(s) / ED Diagnoses Final diagnoses:  Acute midline low back pain without sciatica  Fall, initial encounter  Acute cystitis without hematuria    Rx / DC Orders ED Discharge Orders          Ordered    nitrofurantoin, macrocrystal-monohydrate, (MACROBID) 100 MG capsule  2 times daily        07/14/22 1938    diclofenac Sodium (VOLTAREN) 1 % GEL  4 times daily PRN        07/14/22 1939              Precious Gilding, DO 07/14/22 1954    Lucrezia Starch, MD 07/15/22 2202

## 2022-07-14 NOTE — Discharge Instructions (Signed)
Thank you for allowing [place] to care for you today.  For your back pain, a prescription of Voltaren gel has been sent to your pharmacy.  I have also sent a prescription for an antibiotic called Macrobid for a urinary tract infection.  Please take this antibiotic to completion.  I recommend following up with your primary care doctor within 1 week of discharge for a follow-up visit and also to discuss risks and benefits of staying on your blood thinner considering your risk of falls.

## 2022-07-14 NOTE — Telephone Encounter (Signed)
ED RNCM attempted to contact daughter Casimer Bilis to discuss the criteria for SNF placement no answer TOC will made second attempt tomorrow.

## 2022-07-14 NOTE — ED Notes (Signed)
Pt transported to radiology.

## 2022-07-14 NOTE — ED Triage Notes (Signed)
Mechanical fall this morning 530 am.  C/o lower back pain.  Unsure if she hit her head. Denies neck pain, head pain, vision changes.  + blood thinners (Eliquis) Progressive weakness BLLE. Prone to falls. Recently in rehab for this. Pupils equal, round, reactive.

## 2022-07-15 ENCOUNTER — Telehealth: Payer: Self-pay | Admitting: Surgery

## 2022-07-17 ENCOUNTER — Emergency Department (HOSPITAL_COMMUNITY): Payer: Medicare Other

## 2022-07-17 ENCOUNTER — Telehealth: Payer: Self-pay

## 2022-07-17 ENCOUNTER — Other Ambulatory Visit: Payer: Self-pay

## 2022-07-17 ENCOUNTER — Observation Stay (HOSPITAL_COMMUNITY)
Admission: EM | Admit: 2022-07-17 | Discharge: 2022-07-19 | Disposition: A | Discharge status: Skilled Nursing Facility | Payer: Medicare Other | Attending: Internal Medicine | Admitting: Internal Medicine

## 2022-07-17 DIAGNOSIS — Z79899 Other long term (current) drug therapy: Secondary | ICD-10-CM | POA: Insufficient documentation

## 2022-07-17 DIAGNOSIS — E876 Hypokalemia: Secondary | ICD-10-CM

## 2022-07-17 DIAGNOSIS — S32028A Other fracture of second lumbar vertebra, initial encounter for closed fracture: Secondary | ICD-10-CM | POA: Diagnosis not present

## 2022-07-17 DIAGNOSIS — S32020A Wedge compression fracture of second lumbar vertebra, initial encounter for closed fracture: Secondary | ICD-10-CM | POA: Diagnosis not present

## 2022-07-17 DIAGNOSIS — Z8673 Personal history of transient ischemic attack (TIA), and cerebral infarction without residual deficits: Secondary | ICD-10-CM | POA: Diagnosis not present

## 2022-07-17 DIAGNOSIS — Z86711 Personal history of pulmonary embolism: Secondary | ICD-10-CM | POA: Insufficient documentation

## 2022-07-17 DIAGNOSIS — E039 Hypothyroidism, unspecified: Secondary | ICD-10-CM | POA: Diagnosis not present

## 2022-07-17 DIAGNOSIS — Z853 Personal history of malignant neoplasm of breast: Secondary | ICD-10-CM | POA: Insufficient documentation

## 2022-07-17 DIAGNOSIS — S32000A Wedge compression fracture of unspecified lumbar vertebra, initial encounter for closed fracture: Secondary | ICD-10-CM | POA: Diagnosis present

## 2022-07-17 DIAGNOSIS — Z87891 Personal history of nicotine dependence: Secondary | ICD-10-CM | POA: Diagnosis not present

## 2022-07-17 DIAGNOSIS — D509 Iron deficiency anemia, unspecified: Secondary | ICD-10-CM | POA: Insufficient documentation

## 2022-07-17 DIAGNOSIS — Y92009 Unspecified place in unspecified non-institutional (private) residence as the place of occurrence of the external cause: Secondary | ICD-10-CM | POA: Diagnosis not present

## 2022-07-17 DIAGNOSIS — Z86718 Personal history of other venous thrombosis and embolism: Secondary | ICD-10-CM | POA: Insufficient documentation

## 2022-07-17 DIAGNOSIS — R2681 Unsteadiness on feet: Secondary | ICD-10-CM | POA: Insufficient documentation

## 2022-07-17 DIAGNOSIS — D649 Anemia, unspecified: Secondary | ICD-10-CM

## 2022-07-17 DIAGNOSIS — M6281 Muscle weakness (generalized): Secondary | ICD-10-CM | POA: Insufficient documentation

## 2022-07-17 DIAGNOSIS — W1839XA Other fall on same level, initial encounter: Secondary | ICD-10-CM | POA: Diagnosis not present

## 2022-07-17 DIAGNOSIS — R109 Unspecified abdominal pain: Secondary | ICD-10-CM | POA: Diagnosis not present

## 2022-07-17 DIAGNOSIS — D75839 Thrombocytosis, unspecified: Secondary | ICD-10-CM | POA: Diagnosis not present

## 2022-07-17 DIAGNOSIS — N390 Urinary tract infection, site not specified: Secondary | ICD-10-CM

## 2022-07-17 DIAGNOSIS — M545 Low back pain, unspecified: Secondary | ICD-10-CM | POA: Diagnosis present

## 2022-07-17 DIAGNOSIS — Z7982 Long term (current) use of aspirin: Secondary | ICD-10-CM | POA: Diagnosis not present

## 2022-07-17 LAB — CBC WITH DIFFERENTIAL/PLATELET
Abs Immature Granulocytes: 0.02 10*3/uL (ref 0.00–0.07)
Basophils Absolute: 0 10*3/uL (ref 0.0–0.1)
Basophils Relative: 1 %
Eosinophils Absolute: 0.2 10*3/uL (ref 0.0–0.5)
Eosinophils Relative: 3 %
HCT: 34.3 % — ABNORMAL LOW (ref 36.0–46.0)
Hemoglobin: 9.7 g/dL — ABNORMAL LOW (ref 12.0–15.0)
Immature Granulocytes: 0 %
Lymphocytes Relative: 20 %
Lymphs Abs: 1.3 10*3/uL (ref 0.7–4.0)
MCH: 19.4 pg — ABNORMAL LOW (ref 26.0–34.0)
MCHC: 28.3 g/dL — ABNORMAL LOW (ref 30.0–36.0)
MCV: 68.5 fL — ABNORMAL LOW (ref 80.0–100.0)
Monocytes Absolute: 1 10*3/uL (ref 0.1–1.0)
Monocytes Relative: 15 %
Neutro Abs: 3.9 10*3/uL (ref 1.7–7.7)
Neutrophils Relative %: 61 %
Platelets: 520 10*3/uL — ABNORMAL HIGH (ref 150–400)
RBC: 5.01 MIL/uL (ref 3.87–5.11)
RDW: 21.2 % — ABNORMAL HIGH (ref 11.5–15.5)
WBC: 6.4 10*3/uL (ref 4.0–10.5)
nRBC: 0 % (ref 0.0–0.2)

## 2022-07-17 LAB — COMPREHENSIVE METABOLIC PANEL
ALT: 12 U/L (ref 0–44)
AST: 17 U/L (ref 15–41)
Albumin: 3.3 g/dL — ABNORMAL LOW (ref 3.5–5.0)
Alkaline Phosphatase: 57 U/L (ref 38–126)
Anion gap: 7 (ref 5–15)
BUN: 19 mg/dL (ref 8–23)
CO2: 27 mmol/L (ref 22–32)
Calcium: 8.9 mg/dL (ref 8.9–10.3)
Chloride: 110 mmol/L (ref 98–111)
Creatinine, Ser: 1.07 mg/dL — ABNORMAL HIGH (ref 0.44–1.00)
GFR, Estimated: 52 mL/min — ABNORMAL LOW (ref >60)
Glucose, Bld: 114 mg/dL — ABNORMAL HIGH (ref 70–99)
Potassium: 3.6 mmol/L (ref 3.5–5.1)
Sodium: 144 mmol/L (ref 135–145)
Total Bilirubin: 0.5 mg/dL (ref 0.3–1.2)
Total Protein: 6.4 g/dL — ABNORMAL LOW (ref 6.5–8.1)

## 2022-07-17 MED ORDER — OXYCODONE-ACETAMINOPHEN 5-325 MG PO TABS
1.0000 | ORAL_TABLET | Freq: Four times a day (QID) | ORAL | Status: DC | PRN
  Administered 2022-07-18 – 2022-07-19 (×5): 1 via ORAL
  Filled 2022-07-17 (×5): qty 1

## 2022-07-17 MED ORDER — HYDROMORPHONE HCL 1 MG/ML IJ SOLN: 0.5000 mg | INTRAMUSCULAR | Status: DC | PRN

## 2022-07-17 MED ORDER — OXYCODONE HCL 5 MG PO TABS
2.5000 mg | ORAL_TABLET | Freq: Once | ORAL | Status: AC
  Administered 2022-07-17: 2.5 mg via ORAL
  Filled 2022-07-17: qty 1

## 2022-07-17 MED ORDER — LEVOTHYROXINE SODIUM 88 MCG PO TABS
88.0000 ug | ORAL_TABLET | Freq: Every day | ORAL | Status: DC
  Administered 2022-07-18 – 2022-07-19 (×2): 88 ug via ORAL
  Filled 2022-07-17 (×2): qty 1

## 2022-07-17 MED ORDER — DICLOFENAC SODIUM 1 % EX GEL
4.0000 g | Freq: Four times a day (QID) | CUTANEOUS | Status: DC | PRN
Start: 2022-07-17 — End: 2022-07-19

## 2022-07-17 MED ORDER — KETOROLAC TROMETHAMINE 15 MG/ML IJ SOLN
15.0000 mg | Freq: Once | INTRAMUSCULAR | Status: AC
  Administered 2022-07-17: 15 mg via INTRAVENOUS
  Filled 2022-07-17: qty 1

## 2022-07-17 MED ORDER — POLYETHYLENE GLYCOL 3350 17 G PO PACK: 17.0000 g | PACK | Freq: Every day | ORAL | Status: DC | PRN

## 2022-07-17 MED ORDER — ACETAMINOPHEN 325 MG PO TABS
650.0000 mg | ORAL_TABLET | Freq: Four times a day (QID) | ORAL | Status: DC | PRN
  Administered 2022-07-19: 650 mg via ORAL
  Filled 2022-07-17: qty 2

## 2022-07-17 MED ORDER — KETOROLAC TROMETHAMINE 15 MG/ML IJ SOLN
15.0000 mg | Freq: Four times a day (QID) | INTRAMUSCULAR | Status: DC | PRN
  Administered 2022-07-18: 15 mg via INTRAVENOUS
  Filled 2022-07-17: qty 1

## 2022-07-17 MED ORDER — ASPIRIN 81 MG PO TBEC
81.0000 mg | DELAYED_RELEASE_TABLET | Freq: Every day | ORAL | Status: DC
  Administered 2022-07-17 – 2022-07-18 (×2): 81 mg via ORAL
  Filled 2022-07-17 (×2): qty 1

## 2022-07-17 MED ORDER — ACETAMINOPHEN 500 MG PO TABS
1000.0000 mg | ORAL_TABLET | Freq: Once | ORAL | Status: AC
  Administered 2022-07-17: 1000 mg via ORAL
  Filled 2022-07-17: qty 2

## 2022-07-17 MED ORDER — DIAZEPAM 2 MG PO TABS
2.0000 mg | ORAL_TABLET | Freq: Once | ORAL | Status: AC
  Administered 2022-07-17: 2 mg via ORAL
  Filled 2022-07-17: qty 1

## 2022-07-17 MED ORDER — ACETAMINOPHEN 650 MG RE SUPP: 650.0000 mg | Freq: Four times a day (QID) | RECTAL | Status: DC | PRN

## 2022-07-17 MED ORDER — ENOXAPARIN SODIUM 40 MG/0.4ML IJ SOSY
40.0000 mg | PREFILLED_SYRINGE | INTRAMUSCULAR | Status: DC
Start: 2022-07-18 — End: 2022-07-19
  Administered 2022-07-18 – 2022-07-19 (×2): 40 mg via SUBCUTANEOUS
  Filled 2022-07-17 (×2): qty 0.4

## 2022-07-17 MED ORDER — SODIUM CHLORIDE 0.9 % IV BOLUS
1000.0000 mL | Freq: Once | INTRAVENOUS | Status: AC
  Administered 2022-07-17: 1000 mL via INTRAVENOUS

## 2022-07-17 MED ORDER — NITROFURANTOIN MONOHYD MACRO 100 MG PO CAPS
100.0000 mg | ORAL_CAPSULE | Freq: Two times a day (BID) | ORAL | Status: DC
  Administered 2022-07-17 – 2022-07-19 (×4): 100 mg via ORAL
  Filled 2022-07-17 (×5): qty 1

## 2022-07-17 MED ORDER — SODIUM CHLORIDE 0.9% FLUSH
3.0000 mL | Freq: Two times a day (BID) | INTRAVENOUS | Status: DC
  Administered 2022-07-18 – 2022-07-19 (×3): 3 mL via INTRAVENOUS

## 2022-07-17 NOTE — H&P (Addendum)
History and Physical   Cynthia Ho SWF:[id number] DOB: January 30, 1936 DOA: 07/17/2022  PCP: Guadlupe Spanish, MD   Patient coming from: Home  Chief Complaint: Back pain  HPI: Cynthia Ho is a 86 y.o. female with medical history significant of breast cancer, DVT, PE, CVA, carotid artery disease, neurocognitive disease, PVD presenting with back pain.  Patient has had 3 days of lumbar back pain after a fall 3 days ago.  She was evaluated stand-alone ED at that time felt better was discharged home.  However pain has actually persisted and has been severe and has been unable to walk has been largely bedbound for the past several days.  She denies any radiation of the pain and that is continues to be located at her low back.  Denies any weakness, numbness, incontinence.  She additionally denies fevers, chills, chest pain, shortness of breath, abdominal pain, constipation, diarrhea, nausea, vomiting.  ED Course: Vital signs in the ED stable.  Lab work-up included CMP with creatinine stable 1.27, glucose 114, protein 6.4, albumin 3.3.  CBC with hemoglobin stable at 9.7, thrombocytosis at 520.  Imaging included CT renal stone study which showed probable nonobstructive readings renal stone, no evidence of obstructing renal stones, no hydronephrosis, diverticulosis, compression deformity at L2, hiatal hernia.  CT of the L-spine showed acute compression fracture at L2 with 20% height loss and retropulsion.  Also noted were degenerative changes at L4-5 with moderate to severe spinal stenosis.  Patient received Tylenol, Toradol, oxycodone, Valium, and a liter of fluid in the ED.  Neurosurgery was consulted and recommended lumbar corset and follow-up in office is able to ambulate and admission with PT and OT evaluations unable to ambulate.  Patient was unable to ambulate in part due to pain in the ED. Review of Systems: As per HPI otherwise all other systems reviewed and are negative.  Past Medical History:   Diagnosis Date   Admission for chemotherapy    Breast cancer ([place])    DVT (deep venous thrombosis) ([place])    Stroke [facility])     Past Surgical History:  Procedure Laterality Date   APPENDECTOMY      Social History  reports that she has quit smoking. Her smoking use included cigarettes. She has never been exposed to tobacco smoke. She has never used smokeless tobacco. She reports that she does not drink alcohol and does not use drugs.  No Known Allergies  No family history on file.  Prior to Admission medications   Medication Sig Start Date End Date Taking? Authorizing Provider  BAYER LOW DOSE 81 MG tablet Take 81 mg by mouth at bedtime. Swallow whole.   Yes [provider]  diclofenac Sodium (VOLTAREN) 1 % GEL Apply 4 g topically 4 (four) times daily as needed. Patient taking differently: Apply 4 g topically 4 (four) times daily as needed (to affected areas- for pain). 07/14/22  Yes Precious Gilding, DO  levothyroxine (SYNTHROID) 88 MCG tablet Take 88 mcg by mouth daily before breakfast.   Yes [provider]  nitrofurantoin, macrocrystal-monohydrate, (MACROBID) 100 MG capsule Take 1 capsule (100 mg total) by mouth 2 (two) times daily for 7 days. 07/14/22 07/21/22 Yes Precious Gilding, DO  Prenat-Fe Carbonyl-FA-Omega 3 (ONE-A-DAY WOMENS PRENATAL 1 PO) Take 1 capsule by mouth daily with breakfast.   Yes [provider]   Physical Exam: Vitals:   07/17/22 1929 07/17/22 1930  BP:  (!) 137/58  Pulse:  65  Resp:  17  Temp:  98.6 F (37  C)  TempSrc:  Oral  SpO2:  95%  Weight: 68 kg   Height: '5\' 5"'$  (1.651 m)     Physical Exam Constitutional:      General: She is not in acute distress.    Appearance: Normal appearance.  HENT:     Head: Normocephalic and atraumatic.     Mouth/Throat:     Mouth: Mucous membranes are moist.     Pharynx: Oropharynx is clear.  Eyes:     Extraocular Movements: Extraocular movements intact.     Pupils: Pupils are equal, round, and  reactive to light.  Cardiovascular:     Rate and Rhythm: Normal rate and regular rhythm.     Pulses: Normal pulses.     Heart sounds: Normal heart sounds.  Pulmonary:     Effort: Pulmonary effort is normal. No respiratory distress.     Breath sounds: Normal breath sounds.  Abdominal:     General: Bowel sounds are normal. There is no distension.     Palpations: Abdomen is soft.     Tenderness: There is no abdominal tenderness.  Musculoskeletal:        General: Tenderness (Lumbar Spine) present. No swelling or deformity.  Skin:    General: Skin is warm and dry.  Neurological:     General: No focal deficit present.     Mental Status: Mental status is at baseline.    Labs on Admission: I have personally reviewed following labs and imaging studies  CBC: Recent Labs  Lab 07/14/22 1800 07/17/22 2057  WBC 6.9 6.4  NEUTROABS 5.9 3.9  HGB 9.6* 9.7*  HCT 32.6* 34.3*  MCV 66.8* 68.5*  PLT 471* 520*    Basic Metabolic Panel: Recent Labs  Lab 07/14/22 1800 07/17/22 2057  NA 139 144  K 3.7 3.6  CL 104 110  CO2 27 27  GLUCOSE 116* 114*  BUN 17 19  CREATININE 1.09* 1.07*  CALCIUM 8.8* 8.9    GFR: Estimated Creatinine Clearance: 35.8 mL/min (A) (by C-G formula based on SCr of 1.07 mg/dL (H)).  Liver Function Tests: Recent Labs  Lab 07/17/22 2057  AST 17  ALT 12  ALKPHOS 57  BILITOT 0.5  PROT 6.4*  ALBUMIN 3.3*    Urine analysis:    Component Value Date/Time   COLORURINE YELLOW 07/14/2022 1813   APPEARANCEUR CLOUDY (A) 07/14/2022 1813   LABSPEC 1.020 07/14/2022 1813   PHURINE 6.0 07/14/2022 1813   GLUCOSEU NEGATIVE 07/14/2022 1813   HGBUR SMALL (A) 07/14/2022 1813   BILIRUBINUR NEGATIVE 07/14/2022 1813   KETONESUR NEGATIVE 07/14/2022 1813   PROTEINUR NEGATIVE 07/14/2022 1813   NITRITE POSITIVE (A) 07/14/2022 1813   LEUKOCYTESUR LARGE (A) 07/14/2022 1813    Radiological Exams on Admission: CT L-SPINE NO CHARGE  Result Date: 07/17/2022 CLINICAL DATA:   Initial evaluation for acute trauma, fall. EXAM: CT LUMBAR SPINE WITHOUT CONTRAST TECHNIQUE: Multidetector CT imaging of the lumbar spine was performed without intravenous contrast administration. Multiplanar CT image reconstructions were also generated. RADIATION DOSE REDUCTION: This exam was performed according to the departmental dose-optimization program which includes automated exposure control, adjustment of the mA and/or kV according to patient size and/or use of iterative reconstruction technique. COMPARISON:  Radiograph from 07/14/2022. FINDINGS: Segmentation: Standard. Lowest well-formed disc space labeled the L5-S1 level. Alignment: 5 mm facet mediated anterolisthesis of L4 on L5. Underlying levoscoliosis. Vertebrae: Acute appearing compression fracture involving the superior endplate of L2 with mild 20% height loss and 3 mm bony retropulsion. Otherwise, vertebral  body height maintained with no other acute or chronic fracture. Visualized sacrum and pelvis intact. SI joints symmetric and within normal limits. No worrisome osseous lesions. Paraspinal and other soft tissues: Mild edema seen adjacent to the acute L2 compression fracture. Paraspinous soft tissues demonstrate no other acute finding. Advanced aorto bi-iliac atherosclerotic disease. Few scattered simple renal cysts noted, benign in appearance, no follow-up imaging recommended. Right renal nephrolithiasis. Colonic diverticulosis noted. Disc levels: L1-2: 3 mm bony retropulsion related to the L2 compression fracture. Mild facet hypertrophy. No more than mild spinal stenosis. Foramina remain patent. L2-3: Hypertrophy diffuse disc bulge, asymmetric to the right. Mild facet hypertrophy. No significant spinal stenosis. Mild bilateral foraminal narrowing. L3-4: Mild disc bulge with superimposed right subarticular to foraminal disc protrusion (series 505, image 65). Mild facet hypertrophy. Resultant mild narrowing of the right lateral recess. Central  canal remains patent. Mild right L3 foraminal stenosis. L4-5: Anterolisthesis. Associated broad posterior pseudo disc bulge/uncovering. Moderate to severe bilateral facet arthrosis, worse on the right. Resultant moderate to severe canal with bilateral subarticular stenosis. Moderate right worse than left L4 foraminal narrowing. L5-S1: Disc bulge with intervertebral disc space narrowing and mild reactive endplate spurring. Moderate facet hypertrophy. No significant spinal stenosis. Moderate bilateral L5 foraminal narrowing. IMPRESSION: 1. Acute compression fracture involving the superior endplate of L2 with mild 20% height loss and 3 mm bony retropulsion. No more than mild spinal stenosis at this level. 2. Multifactorial degenerative changes at L4-5 with resultant moderate to severe canal and bilateral subarticular stenosis, with moderate bilateral L4 foraminal narrowing. 3. Nonobstructive right renal nephrolithiasis. 4. Colonic diverticulosis. 5. Aortic Atherosclerosis (ICD10-I70.0). Electronically Signed   By: Jeannine Boga M.D.   On: 07/17/2022 23:02   CT Renal Stone Study  Result Date: 07/17/2022 CLINICAL DATA:  Flank pain after fall. EXAM: CT ABDOMEN AND PELVIS WITHOUT CONTRAST TECHNIQUE: Multidetector CT imaging of the abdomen and pelvis was performed following the standard protocol without IV contrast. RADIATION DOSE REDUCTION: This exam was performed according to the departmental dose-optimization program which includes automated exposure control, adjustment of the mA and/or kV according to patient size and/or use of iterative reconstruction technique. COMPARISON:  July 14, 2022. FINDINGS: Lower chest: No acute abnormality. Hepatobiliary: No focal liver abnormality is seen. No gallstones, gallbladder wall thickening, or biliary dilatation. Pancreas: Unremarkable. No pancreatic ductal dilatation or surrounding inflammatory changes. Spleen: Normal in size without focal abnormality. Adrenals/Urinary  Tract: Adrenal glands appear normal. Probable small right renal calculus is noted. No hydronephrosis is noted. Bilateral parapelvic cysts are noted. Urinary bladder is unremarkable. Stomach/Bowel: Small sliding-type hiatal hernia. The stomach otherwise appears normal. There is no evidence of bowel obstruction or inflammation. Sigmoid diverticulosis is noted without inflammation. Status post appendectomy. Vascular/Lymphatic: Aortic atherosclerosis. No enlarged abdominal or pelvic lymph nodes. Reproductive: Status post hysterectomy. No adnexal masses. Other: No abdominal wall hernia or abnormality. No abdominopelvic ascites. Musculoskeletal: Mild compression deformity of L2 vertebral body is again noted consistent with acute to subacute fracture. IMPRESSION: Probable small nonobstructive right renal calculus. No hydronephrosis or renal obstruction is noted. Sigmoid diverticulosis without inflammation. Mild compression deformity of L2 vertebral body is again noted consistent with acute to subacute fracture. Small sliding-type hiatal hernia. Aortic Atherosclerosis (ICD10-I70.0). Electronically Signed   By: Marijo Conception M.D.   On: 07/17/2022 21:27    EKG: Not performed in the emergency department  Assessment/Plan Principal Problem:   Lumbar compression fracture [facility]) Active Problems:   Anemia   History of CVA (cerebrovascular accident)  Hypothyroidism   UTI (urinary tract infection)   Lumbar compression fracture > Patient presenting with back pain after fall 3 days ago.  Had had a lumbar compression fracture noted on x-ray 3 days ago.  > Has had persistent pain and being unable to walk and has been largely bedbound since that time.  > CT imaging here confirms a compression fracture at L2 with 20% height loss and retropulsion. > Despite pain medication and lumbar corset patient been unable to ambulate successfully in ED. > Neurosurgery was consulted and recommended lumbar corset and follow-up  outpatient if able to ambulate and admission with PT OT if not. > Does not appear that they are following patient, reconsult neurosurgery as needed. - Monitor on MedSurg with continuous pulse ox - Continue with lumbar corset - PT/OT eval and treat - As needed pain control with Tylenol for mild pain, oxycodone for moderate to severe pain, Toradol for severe breakthrough pain (up to 2 doses)  History of CVA - Continue home aspirin  Hypothyroidism - Continue home Synthroid  Anemia > Hemoglobin stable at 9.7 - Trend CBC  UTI > Undergoing treatment for UTI - Continue Macrobid  DVT prophylaxis: Lovenox Code Status:   Full  Family Communication:  Updated at bedside  Disposition Plan:   Patient is from:  Home  Anticipated DC to:  Home  Anticipated DC date:  1 to 4 days  Anticipated DC barriers: None  Consults called:  General surgery, case discussed in the ED, are not following at this time, reconsult as needed. Admission status:  Observation, MedSurg with continuous pulse ox  Severity of Illness: The appropriate patient status for this patient is OBSERVATION. Observation status is judged to be reasonable and necessary in order to provide the required intensity of service to ensure the patient's safety. The patient's presenting symptoms, physical exam findings, and initial radiographic and laboratory data in the context of their medical condition is felt to place them at decreased risk for further clinical deterioration. Furthermore, it is anticipated that the patient will be medically stable for discharge from the hospital within 2 midnights of admission.    Marcelyn Bruins MD [facility]  How to contact the [facility] Attending or Consulting provider [place] or covering provider during after hours [place], for this patient?   Check the care team in [facility] and look for a) attending/consulting [facility] provider listed and b) the [facility] team listed Log into [email or web address] and use [place]'s  universal password to access. If you do not have the password, please contact the hospital operator. Locate the [facility] provider you are looking for under [facility] and page to a number that you can be directly reached. If you still have difficulty reaching the provider, please page the [facility] (Director on Call) for the Hospitalists listed on amion for assistance.  07/17/2022, 11:54 PM

## 2022-07-17 NOTE — Progress Notes (Signed)
Orthopedic Tech Progress Note Patient Details:  Cynthia Ho Mar 30, 1936 [id number]  Patient ID: Cynthia Ho, female   DOB: 11-07-1936, 86 y.o.   MRN: [id number] Order placed with [facility] for [facility].  Vernona Rieger 07/17/2022, 11:14 PM

## 2022-07-17 NOTE — ED Triage Notes (Signed)
BIBA from home had a fall Sun went to [employer] and was released been in pain since and getting worse lower back pain

## 2022-07-17 NOTE — Telephone Encounter (Signed)
RNCM made follow up call to patient's daughter Jodi Mourning [phone], re: SNF placement process. This RNCM reached message "Call can not be completed at this time, call back later", this RNCM unable to leave a voicemail.   No additional TOC needs at this time.

## 2022-07-17 NOTE — ED Provider Notes (Signed)
[facility] DEPT Provider Note   CSN: [id number] Arrival date & time: 07/17/22  1921     History  Chief Complaint  Patient presents with   Back Pain    Cynthia Ho is a 86 y.o. female.  86 yo F with a chief complaints of back pain.  This been going on for a few days.  She had suffered a nonsyncopal fall at home and has not been able to get up and walk afterwards.  She denies radiation down her legs.  Denies numbness or weakness of the legs denies loss of bowel or bladder denies loss of peritoneal sensation.  She does have chronic arthritis and has had chronic problems with her back in the past.  She states that the pain is all the way across the bottom of her back.   Back Pain      Home Medications Prior to Admission medications   Medication Sig Start Date End Date Taking? Authorizing Provider  BAYER LOW DOSE 81 MG tablet Take 81 mg by mouth at bedtime. Swallow whole.   Yes [provider]  diclofenac Sodium (VOLTAREN) 1 % GEL Apply 4 g topically 4 (four) times daily as needed. Patient taking differently: Apply 4 g topically 4 (four) times daily as needed (to affected areas- for pain). 07/14/22  Yes Precious Gilding, DO  levothyroxine (SYNTHROID) 88 MCG tablet Take 88 mcg by mouth daily before breakfast.   Yes [provider]  nitrofurantoin, macrocrystal-monohydrate, (MACROBID) 100 MG capsule Take 1 capsule (100 mg total) by mouth 2 (two) times daily for 7 days. 07/14/22 07/21/22 Yes Precious Gilding, DO  Prenat-Fe Carbonyl-FA-Omega 3 (ONE-A-DAY WOMENS PRENATAL 1 PO) Take 1 capsule by mouth daily with breakfast.   Yes [provider]      Allergies    Patient has no known allergies.    Review of Systems   Review of Systems  Musculoskeletal:  Positive for back pain.    Physical Exam Updated Vital Signs BP (!) 137/58 (BP Location: Right Arm)   Pulse 65   Temp 98.6 F (37 C) (Oral)   Resp 17   Ht '5\' 5"'$  (1.651 m)   Wt  68 kg   SpO2 95%   BMI 24.95 kg/m  Physical Exam Vitals and nursing note reviewed.  Constitutional:      General: She is not in acute distress.    Appearance: She is well-developed. She is not diaphoretic.  HENT:     Head: Normocephalic and atraumatic.  Eyes:     Pupils: Pupils are equal, round, and reactive to light.  Cardiovascular:     Rate and Rhythm: Normal rate and regular rhythm.     Heart sounds: No murmur heard.    No friction rub. No gallop.  Pulmonary:     Effort: Pulmonary effort is normal.     Breath sounds: No wheezing or rales.  Abdominal:     General: There is no distension.     Palpations: Abdomen is soft.     Tenderness: There is no abdominal tenderness.  Musculoskeletal:        General: No tenderness.     Cervical back: Normal range of motion and neck supple.     Comments: No obvious midline spinal tenderness step-offs or deformities.  Skin:    General: Skin is warm and dry.  Neurological:     Mental Status: She is alert and oriented to person, place, and time.  Psychiatric:  Behavior: Behavior normal.     ED Results / Procedures / Treatments   Labs (all labs ordered are listed, but only abnormal results are displayed) Labs Reviewed  CBC WITH DIFFERENTIAL/PLATELET - Abnormal; Notable for the following components:      Result Value   Hemoglobin 9.7 (*)    HCT 34.3 (*)    MCV 68.5 (*)    MCH 19.4 (*)    MCHC 28.3 (*)    RDW 21.2 (*)    Platelets 520 (*)    All other components within normal limits  COMPREHENSIVE METABOLIC PANEL - Abnormal; Notable for the following components:   Glucose, Bld 114 (*)    Creatinine, Ser 1.07 (*)    Total Protein 6.4 (*)    Albumin 3.3 (*)    GFR, Estimated 52 (*)    All other components within normal limits    EKG None  Radiology CT L-SPINE NO CHARGE  Result Date: 07/17/2022 CLINICAL DATA:  Initial evaluation for acute trauma, fall. EXAM: CT LUMBAR SPINE WITHOUT CONTRAST TECHNIQUE: Multidetector  CT imaging of the lumbar spine was performed without intravenous contrast administration. Multiplanar CT image reconstructions were also generated. RADIATION DOSE REDUCTION: This exam was performed according to the departmental dose-optimization program which includes automated exposure control, adjustment of the mA and/or kV according to patient size and/or use of iterative reconstruction technique. COMPARISON:  Radiograph from 07/14/2022. FINDINGS: Segmentation: Standard. Lowest well-formed disc space labeled the L5-S1 level. Alignment: 5 mm facet mediated anterolisthesis of L4 on L5. Underlying levoscoliosis. Vertebrae: Acute appearing compression fracture involving the superior endplate of L2 with mild 20% height loss and 3 mm bony retropulsion. Otherwise, vertebral body height maintained with no other acute or chronic fracture. Visualized sacrum and pelvis intact. SI joints symmetric and within normal limits. No worrisome osseous lesions. Paraspinal and other soft tissues: Mild edema seen adjacent to the acute L2 compression fracture. Paraspinous soft tissues demonstrate no other acute finding. Advanced aorto bi-iliac atherosclerotic disease. Few scattered simple renal cysts noted, benign in appearance, no follow-up imaging recommended. Right renal nephrolithiasis. Colonic diverticulosis noted. Disc levels: L1-2: 3 mm bony retropulsion related to the L2 compression fracture. Mild facet hypertrophy. No more than mild spinal stenosis. Foramina remain patent. L2-3: Hypertrophy diffuse disc bulge, asymmetric to the right. Mild facet hypertrophy. No significant spinal stenosis. Mild bilateral foraminal narrowing. L3-4: Mild disc bulge with superimposed right subarticular to foraminal disc protrusion (series 505, image 65). Mild facet hypertrophy. Resultant mild narrowing of the right lateral recess. Central canal remains patent. Mild right L3 foraminal stenosis. L4-5: Anterolisthesis. Associated broad posterior  pseudo disc bulge/uncovering. Moderate to severe bilateral facet arthrosis, worse on the right. Resultant moderate to severe canal with bilateral subarticular stenosis. Moderate right worse than left L4 foraminal narrowing. L5-S1: Disc bulge with intervertebral disc space narrowing and mild reactive endplate spurring. Moderate facet hypertrophy. No significant spinal stenosis. Moderate bilateral L5 foraminal narrowing. IMPRESSION: 1. Acute compression fracture involving the superior endplate of L2 with mild 20% height loss and 3 mm bony retropulsion. No more than mild spinal stenosis at this level. 2. Multifactorial degenerative changes at L4-5 with resultant moderate to severe canal and bilateral subarticular stenosis, with moderate bilateral L4 foraminal narrowing. 3. Nonobstructive right renal nephrolithiasis. 4. Colonic diverticulosis. 5. Aortic Atherosclerosis (ICD10-I70.0). Electronically Signed   By: Jeannine Boga M.D.   On: 07/17/2022 23:02   CT Renal Stone Study  Result Date: 07/17/2022 CLINICAL DATA:  Flank pain after fall. EXAM: CT ABDOMEN  AND PELVIS WITHOUT CONTRAST TECHNIQUE: Multidetector CT imaging of the abdomen and pelvis was performed following the standard protocol without IV contrast. RADIATION DOSE REDUCTION: This exam was performed according to the departmental dose-optimization program which includes automated exposure control, adjustment of the mA and/or kV according to patient size and/or use of iterative reconstruction technique. COMPARISON:  July 14, 2022. FINDINGS: Lower chest: No acute abnormality. Hepatobiliary: No focal liver abnormality is seen. No gallstones, gallbladder wall thickening, or biliary dilatation. Pancreas: Unremarkable. No pancreatic ductal dilatation or surrounding inflammatory changes. Spleen: Normal in size without focal abnormality. Adrenals/Urinary Tract: Adrenal glands appear normal. Probable small right renal calculus is noted. No hydronephrosis is  noted. Bilateral parapelvic cysts are noted. Urinary bladder is unremarkable. Stomach/Bowel: Small sliding-type hiatal hernia. The stomach otherwise appears normal. There is no evidence of bowel obstruction or inflammation. Sigmoid diverticulosis is noted without inflammation. Status post appendectomy. Vascular/Lymphatic: Aortic atherosclerosis. No enlarged abdominal or pelvic lymph nodes. Reproductive: Status post hysterectomy. No adnexal masses. Other: No abdominal wall hernia or abnormality. No abdominopelvic ascites. Musculoskeletal: Mild compression deformity of L2 vertebral body is again noted consistent with acute to subacute fracture. IMPRESSION: Probable small nonobstructive right renal calculus. No hydronephrosis or renal obstruction is noted. Sigmoid diverticulosis without inflammation. Mild compression deformity of L2 vertebral body is again noted consistent with acute to subacute fracture. Small sliding-type hiatal hernia. Aortic Atherosclerosis (ICD10-I70.0). Electronically Signed   By: Marijo Conception M.D.   On: 07/17/2022 21:27    Procedures Procedures    Medications Ordered in ED Medications  acetaminophen (TYLENOL) tablet 1,000 mg (1,000 mg Oral Given 07/17/22 2140)  sodium chloride 0.9 % bolus 1,000 mL (1,000 mLs Intravenous New Bag/Given 07/17/22 2140)  oxyCODONE (Oxy IR/ROXICODONE) immediate release tablet 2.5 mg (2.5 mg Oral Given 07/17/22 2140)  ketorolac (TORADOL) 15 MG/ML injection 15 mg (15 mg Intravenous Given 07/17/22 2142)  diazepam (VALIUM) tablet 2 mg (2 mg Oral Given 07/17/22 2147)    ED Course/ Medical Decision Making/ A&P                           Medical Decision Making Amount and/or Complexity of Data Reviewed Labs: ordered. Radiology: ordered.  Risk OTC drugs. Prescription drug management.   86 yo F with a chief complaints of back pain.  This has been going on for about 48 hours after a nonsyncopal fall.  Per the patient and family she has not had anything  to eat or drink over the past 48 hours.  We will obtain a laboratory evaluation bolus of IV fluids CT stone study and reformats of the L-spine.  Treat pain and nausea.  Reassess.  CT scan concerning for L2 compression fracture.  I discussed this with Margo Aye from neurosurgery.  Recommended lumbar corset and follow-up in the office if able.  If unable to ambulate then recommended medical admission.  Patient unfortunately is unable to ambulate.  Still describes severe pain when she tries to sit up.  Will discuss with medicine for admission.  The patients results and plan were reviewed and discussed.   Any x-rays performed were independently reviewed by myself.   Differential diagnosis were considered with the presenting HPI.  Medications  acetaminophen (TYLENOL) tablet 1,000 mg (1,000 mg Oral Given 07/17/22 2140)  sodium chloride 0.9 % bolus 1,000 mL (1,000 mLs Intravenous New Bag/Given 07/17/22 2140)  oxyCODONE (Oxy IR/ROXICODONE) immediate release tablet 2.5 mg (2.5 mg Oral Given 07/17/22 2140)  ketorolac (TORADOL) 15 MG/ML injection 15 mg (15 mg Intravenous Given 07/17/22 2142)  diazepam (VALIUM) tablet 2 mg (2 mg Oral Given 07/17/22 2147)    Vitals:   07/17/22 1929 07/17/22 1930  BP:  (!) 137/58  Pulse:  65  Resp:  17  Temp:  98.6 F (37 C)  TempSrc:  Oral  SpO2:  95%  Weight: 68 kg   Height: '5\' 5"'$  (1.651 m)     Final diagnoses:  Closed compression fracture of L2 lumbar vertebra, initial encounter [facility])    Admission/ observation were discussed with the admitting physician, patient and/or family and they are comfortable with the plan.            Final Clinical Impression(s) / ED Diagnoses Final diagnoses:  Closed compression fracture of L2 lumbar vertebra, initial encounter [facility])    Rx / Frost Orders ED Discharge Orders     None         Deno Etienne, DO 07/17/22 2308

## 2022-07-18 ENCOUNTER — Encounter (HOSPITAL_COMMUNITY): Payer: Self-pay | Admitting: Internal Medicine

## 2022-07-18 DIAGNOSIS — Z8673 Personal history of transient ischemic attack (TIA), and cerebral infarction without residual deficits: Secondary | ICD-10-CM | POA: Diagnosis not present

## 2022-07-18 DIAGNOSIS — E039 Hypothyroidism, unspecified: Secondary | ICD-10-CM | POA: Diagnosis not present

## 2022-07-18 DIAGNOSIS — E876 Hypokalemia: Secondary | ICD-10-CM

## 2022-07-18 DIAGNOSIS — S32020A Wedge compression fracture of second lumbar vertebra, initial encounter for closed fracture: Secondary | ICD-10-CM | POA: Diagnosis not present

## 2022-07-18 LAB — CBC
HCT: 30.6 % — ABNORMAL LOW (ref 36.0–46.0)
Hemoglobin: 8.8 g/dL — ABNORMAL LOW (ref 12.0–15.0)
MCH: 19.8 pg — ABNORMAL LOW (ref 26.0–34.0)
MCHC: 28.8 g/dL — ABNORMAL LOW (ref 30.0–36.0)
MCV: 68.8 fL — ABNORMAL LOW (ref 80.0–100.0)
Platelets: 470 10*3/uL — ABNORMAL HIGH (ref 150–400)
RBC: 4.45 MIL/uL (ref 3.87–5.11)
RDW: 20.6 % — ABNORMAL HIGH (ref 11.5–15.5)
WBC: 5.8 10*3/uL (ref 4.0–10.5)
nRBC: 0 % (ref 0.0–0.2)

## 2022-07-18 LAB — BASIC METABOLIC PANEL
Anion gap: 7 (ref 5–15)
BUN: 18 mg/dL (ref 8–23)
CO2: 25 mmol/L (ref 22–32)
Calcium: 8.5 mg/dL — ABNORMAL LOW (ref 8.9–10.3)
Chloride: 112 mmol/L — ABNORMAL HIGH (ref 98–111)
Creatinine, Ser: 0.83 mg/dL (ref 0.44–1.00)
GFR, Estimated: >60 mL/min (ref >60)
Glucose, Bld: 103 mg/dL — ABNORMAL HIGH (ref 70–99)
Potassium: 3.4 mmol/L — ABNORMAL LOW (ref 3.5–5.1)
Sodium: 144 mmol/L (ref 135–145)

## 2022-07-18 MED ORDER — LIDOCAINE 5 % EX PTCH
1.0000 | MEDICATED_PATCH | CUTANEOUS | Status: DC
  Administered 2022-07-18 – 2022-07-19 (×2): 1 via TRANSDERMAL
  Filled 2022-07-18 (×2): qty 1

## 2022-07-18 MED ORDER — POTASSIUM CHLORIDE CRYS ER 20 MEQ PO TBCR
40.0000 meq | EXTENDED_RELEASE_TABLET | Freq: Once | ORAL | Status: AC
  Administered 2022-07-18: 40 meq via ORAL
  Filled 2022-07-18: qty 2

## 2022-07-18 NOTE — Plan of Care (Signed)
  Problem: Education: Goal: Knowledge of General Education information will improve Description: Including pain rating scale, medication(s)/side effects and non-pharmacologic comfort measures Outcome: Progressing   Problem: Health Behavior/Discharge Planning: Goal: Ability to manage health-related needs will improve Outcome: Progressing   Problem: Clinical Measurements: Goal: Ability to maintain clinical measurements within normal limits will improve Outcome: Progressing Goal: Will remain free from infection Outcome: Progressing Goal: Diagnostic test results will improve Outcome: Progressing Goal: Respiratory complications will improve Outcome: Progressing Goal: Cardiovascular complication will be avoided Outcome: Progressing   Problem: Nutrition: Goal: Adequate nutrition will be maintained Outcome: Progressing   Problem: Coping: Goal: Level of anxiety will decrease Outcome: Progressing   Problem: Elimination: Goal: Will not experience complications related to bowel motility Outcome: Progressing Goal: Will not experience complications related to urinary retention Outcome: Progressing   Problem: Skin Integrity: Goal: Risk for impaired skin integrity will decrease Outcome: Progressing   Problem: Safety: Goal: Ability to remain free from injury will improve Outcome: Progressing   

## 2022-07-18 NOTE — Progress Notes (Signed)
PROGRESS NOTE    Cynthia Ho  ATF:[id number] DOB: 10-10-1936 DOA: 07/17/2022 PCP: Guadlupe Spanish, MD   Brief Narrative:   86 y.o. female with medical history significant of breast cancer, DVT, PE, CVA, carotid artery disease, neurocognitive disease, PVD presented with worsening back pain after a fall 3 days prior to presentation.  On presentation CT renal stone study showed no evidence of obstructing renal stones or hydronephrosis.  CT of the lumbar spine showed acute compression fracture at L2 with 20% height loss and retropulsion along with degenerative changes at L4-L5 with moderate to severe spinal stenosis.  Neurosurgery recommended conservative management with lumbar corset and outpatient follow-up and admission with PT and OT evaluations if unable to ambulate.  Patient was unable to ambulate in the ED.  Assessment & Plan:   Lumbar compression fracture Severe lower back pain Ambulatory dysfunction -Presented with worsening lower back pain.  CT imaging as above. -Case discussed by EDP with neurosurgery on-call on 07/17/2022: Neurosurgery recommended conservative management with lumbar corset and outpatient follow-up and admission with PT and OT evaluations if unable to ambulate.  Patient was unable to ambulate in the ED. -PT eval.  Pain management.  Fall precautions. -Continue lumbar corset.  History of unspecified CVA -Continue home aspirin  Hypokalemia -Replace.  Repeat a.m. labs.  Hypothyroidism -Continue levothyroxine  Recent diagnosis of UTI as an outpatient -Was on Macrobid as an outpatient.  Continue with the same to finish course.  Microcytic anemia -Questionable cause.  Hemoglobin stable.  Outpatient follow-up.  Thrombocytosis -Most likely reactive.  Monitor intermittently    DVT prophylaxis: Lovenox Code Status: Full Family Communication: None at bedside Disposition Plan: Status is: Observation The patient will require care spanning > 2 midnights and  should be moved to inpatient because: Of severity of illness.  Still in significant pain.  PT eval pending.  Might need SNF placement.  Consultants: Case discussed by EDP with neurosurgery on-call on 07/17/2022  Procedures: None  Antimicrobials: Macrobid   Subjective: Patient seen and examined at bedside.  Still complains of significant lower back pain.  No fever, vomiting, shortness of breath noted.  Objective: Vitals:   07/18/22 0252 07/18/22 0530 07/18/22 0845 07/18/22 1013  BP: (!) 144/126 (!) 147/60 (!) 165/66   Pulse: 61 (!) 57 (!) 56   Resp: '17 18 16   '$ Temp:  98.2 F (36.8 C)  98.4 F (36.9 C)  TempSrc:  Oral    SpO2: 96% 96% 97%   Weight:      Height:       No intake or output data in the 24 hours ending 07/18/22 1023 Filed Weights   07/17/22 1929  Weight: 68 kg    Examination:  General exam: Appears calm and comfortable.  Currently on room air.  Elderly female lying in the bed. Respiratory system: Bilateral decreased breath sounds at bases Cardiovascular system: S1 & S2 heard, Rate controlled Gastrointestinal system: Abdomen is nondistended, soft and nontender. Normal bowel sounds heard. Extremities: No cyanosis, clubbing, edema  Central nervous system: Alert and oriented. No focal neurological deficits. Moving extremities Skin: No rashes, lesions or ulcers Psychiatry: Judgement and insight appear normal. Mood & affect appropriate.     Data Reviewed: I have personally reviewed following labs and imaging studies  CBC: Recent Labs  Lab 07/14/22 1800 07/17/22 2057 07/18/22 0500  WBC 6.9 6.4 5.8  NEUTROABS 5.9 3.9  --   HGB 9.6* 9.7* 8.8*  HCT 32.6* 34.3* 30.6*  MCV 66.8* 68.5* 68.8*  PLT 471* 520* 449*   Basic Metabolic Panel: Recent Labs  Lab 07/14/22 1800 07/17/22 2057 07/18/22 0500  NA 139 144 144  K 3.7 3.6 3.4*  CL 104 110 112*  CO2 '27 27 25  '$ GLUCOSE 116* 114* 103*  BUN '17 19 18  '$ CREATININE 1.09* 1.07* 0.83  CALCIUM 8.8* 8.9 8.5*    GFR: Estimated Creatinine Clearance: 46.2 mL/min (by C-G formula based on SCr of 0.83 mg/dL). Liver Function Tests: Recent Labs  Lab 07/17/22 2057  AST 17  ALT 12  ALKPHOS 57  BILITOT 0.5  PROT 6.4*  ALBUMIN 3.3*   No results for input(s): "LIPASE", "AMYLASE" in the last 168 hours. No results for input(s): "AMMONIA" in the last 168 hours. Coagulation Profile: No results for input(s): "INR", "PROTIME" in the last 168 hours. Cardiac Enzymes: No results for input(s): "CKTOTAL", "CKMB", "CKMBINDEX", "TROPONINI" in the last 168 hours. BNP (last 3 results) No results for input(s): "PROBNP" in the last 8760 hours. HbA1C: No results for input(s): "HGBA1C" in the last 72 hours. CBG: No results for input(s): "GLUCAP" in the last 168 hours. Lipid Profile: No results for input(s): "CHOL", "HDL", "LDLCALC", "TRIG", "CHOLHDL", "LDLDIRECT" in the last 72 hours. Thyroid Function Tests: No results for input(s): "TSH", "T4TOTAL", "FREET4", "T3FREE", "THYROIDAB" in the last 72 hours. Anemia Panel: No results for input(s): "VITAMINB12", "FOLATE", "FERRITIN", "TIBC", "IRON", "RETICCTPCT" in the last 72 hours. Sepsis Labs: No results for input(s): "PROCALCITON", "LATICACIDVEN" in the last 168 hours.  No results found for this or any previous visit (from the past 240 hour(s)).       Radiology Studies: CT L-SPINE NO CHARGE  Result Date: 07/17/2022 CLINICAL DATA:  Initial evaluation for acute trauma, fall. EXAM: CT LUMBAR SPINE WITHOUT CONTRAST TECHNIQUE: Multidetector CT imaging of the lumbar spine was performed without intravenous contrast administration. Multiplanar CT image reconstructions were also generated. RADIATION DOSE REDUCTION: This exam was performed according to the departmental dose-optimization program which includes automated exposure control, adjustment of the mA and/or kV according to patient size and/or use of iterative reconstruction technique. COMPARISON:  Radiograph from  07/14/2022. FINDINGS: Segmentation: Standard. Lowest well-formed disc space labeled the L5-S1 level. Alignment: 5 mm facet mediated anterolisthesis of L4 on L5. Underlying levoscoliosis. Vertebrae: Acute appearing compression fracture involving the superior endplate of L2 with mild 20% height loss and 3 mm bony retropulsion. Otherwise, vertebral body height maintained with no other acute or chronic fracture. Visualized sacrum and pelvis intact. SI joints symmetric and within normal limits. No worrisome osseous lesions. Paraspinal and other soft tissues: Mild edema seen adjacent to the acute L2 compression fracture. Paraspinous soft tissues demonstrate no other acute finding. Advanced aorto bi-iliac atherosclerotic disease. Few scattered simple renal cysts noted, benign in appearance, no follow-up imaging recommended. Right renal nephrolithiasis. Colonic diverticulosis noted. Disc levels: L1-2: 3 mm bony retropulsion related to the L2 compression fracture. Mild facet hypertrophy. No more than mild spinal stenosis. Foramina remain patent. L2-3: Hypertrophy diffuse disc bulge, asymmetric to the right. Mild facet hypertrophy. No significant spinal stenosis. Mild bilateral foraminal narrowing. L3-4: Mild disc bulge with superimposed right subarticular to foraminal disc protrusion (series 505, image 65). Mild facet hypertrophy. Resultant mild narrowing of the right lateral recess. Central canal remains patent. Mild right L3 foraminal stenosis. L4-5: Anterolisthesis. Associated broad posterior pseudo disc bulge/uncovering. Moderate to severe bilateral facet arthrosis, worse on the right. Resultant moderate to severe canal with bilateral subarticular stenosis. Moderate right worse than left L4 foraminal narrowing. L5-S1: Disc bulge  with intervertebral disc space narrowing and mild reactive endplate spurring. Moderate facet hypertrophy. No significant spinal stenosis. Moderate bilateral L5 foraminal narrowing. IMPRESSION: 1.  Acute compression fracture involving the superior endplate of L2 with mild 20% height loss and 3 mm bony retropulsion. No more than mild spinal stenosis at this level. 2. Multifactorial degenerative changes at L4-5 with resultant moderate to severe canal and bilateral subarticular stenosis, with moderate bilateral L4 foraminal narrowing. 3. Nonobstructive right renal nephrolithiasis. 4. Colonic diverticulosis. 5. Aortic Atherosclerosis (ICD10-I70.0). Electronically Signed   By: Jeannine Boga M.D.   On: 07/17/2022 23:02   CT Renal Stone Study  Result Date: 07/17/2022 CLINICAL DATA:  Flank pain after fall. EXAM: CT ABDOMEN AND PELVIS WITHOUT CONTRAST TECHNIQUE: Multidetector CT imaging of the abdomen and pelvis was performed following the standard protocol without IV contrast. RADIATION DOSE REDUCTION: This exam was performed according to the departmental dose-optimization program which includes automated exposure control, adjustment of the mA and/or kV according to patient size and/or use of iterative reconstruction technique. COMPARISON:  July 14, 2022. FINDINGS: Lower chest: No acute abnormality. Hepatobiliary: No focal liver abnormality is seen. No gallstones, gallbladder wall thickening, or biliary dilatation. Pancreas: Unremarkable. No pancreatic ductal dilatation or surrounding inflammatory changes. Spleen: Normal in size without focal abnormality. Adrenals/Urinary Tract: Adrenal glands appear normal. Probable small right renal calculus is noted. No hydronephrosis is noted. Bilateral parapelvic cysts are noted. Urinary bladder is unremarkable. Stomach/Bowel: Small sliding-type hiatal hernia. The stomach otherwise appears normal. There is no evidence of bowel obstruction or inflammation. Sigmoid diverticulosis is noted without inflammation. Status post appendectomy. Vascular/Lymphatic: Aortic atherosclerosis. No enlarged abdominal or pelvic lymph nodes. Reproductive: Status post hysterectomy. No  adnexal masses. Other: No abdominal wall hernia or abnormality. No abdominopelvic ascites. Musculoskeletal: Mild compression deformity of L2 vertebral body is again noted consistent with acute to subacute fracture. IMPRESSION: Probable small nonobstructive right renal calculus. No hydronephrosis or renal obstruction is noted. Sigmoid diverticulosis without inflammation. Mild compression deformity of L2 vertebral body is again noted consistent with acute to subacute fracture. Small sliding-type hiatal hernia. Aortic Atherosclerosis (ICD10-I70.0). Electronically Signed   By: Marijo Conception M.D.   On: 07/17/2022 21:27        Scheduled Meds:  aspirin EC  81 mg Oral QHS   enoxaparin (LOVENOX) injection  40 mg Subcutaneous Q24H   levothyroxine  88 mcg Oral Q0600   nitrofurantoin (macrocrystal-monohydrate)  100 mg Oral BID   sodium chloride flush  3 mL Intravenous Q12H   Continuous Infusions:        Aline August, MD [facility] 07/18/2022, 10:23 AM

## 2022-07-18 NOTE — Evaluation (Addendum)
Physical Therapy Evaluation Patient Details Name: Cynthia Ho MRN: [id number] DOB: 1936-03-21 Today's Date: 07/18/2022  History of Present Illness  86 y.o. female with medical history significant of breast cancer, DVT, PE, CVA, carotid artery disease, neurocognitive disease, PVD presented with worsening back pain after a fall 3 days prior to presentation.  CT of the lumbar spine showed acute compression fracture at L2 with 20% height loss and retropulsion along with degenerative changes at L4-L5 with moderate to severe spinal stenosis.  Neurosurgery recommended conservative management with lumbar corset and outpatient follow-up and admission with PT and OT evaluations if unable to ambulate.  Patient was unable to ambulate in the ED.    Clinical Impression  Cynthia Ho is 86 y.o. female admitted with above HPI and diagnosis. Patient is currently limited by functional impairments below (see PT problem list). Patient lives with her daughters and is independent with Rollator for household mobility prior to fall ~4 days ago. Currently pt requires mod assist for bed mobility and min assist to transfer and complete short bout of gait with RW. She reports pain improved with use of lumbar corset brace. Patient will benefit from continued skilled PT interventions to address impairments and progress independence with mobility, recommending ST rehab at SNF to improved pt's independence to mobilize prior to return home. Acute PT will follow and progress as able.        Recommendations for follow up therapy are one component of a multi-disciplinary discharge planning process, led by the attending physician.  Recommendations may be updated based on patient status, additional functional criteria and insurance authorization.  Follow Up Recommendations Skilled nursing-short term rehab (<3 hours/day) Can patient physically be transported by private vehicle: Yes    Assistance Recommended at Discharge Frequent  or constant Supervision/Assistance  Patient can return home with the following  A little help with walking and/or transfers;A lot of help with bathing/dressing/bathroom;Assistance with cooking/housework;Assist for transportation;Help with stairs or ramp for entrance    Equipment Recommendations None recommended by PT  Recommendations for Other Services       Functional Status Assessment Patient has had a recent decline in their functional status and demonstrates the ability to make significant improvements in function in a reasonable and predictable amount of time.     Precautions / Restrictions Precautions Precautions: Fall Precaution Comments: fallen at least 6 times in last couple years. this time fell back and hit the minifridge Restrictions Weight Bearing Restrictions: No      Mobility  Bed Mobility Overal bed mobility: Needs Assistance Bed Mobility: Supine to Sit     Supine to sit: Mod assist, HOB elevated Sit to supine: Mod assist, +2 for safety/equipment, +2 for physical assistance   General bed mobility comments: VCs for sequencing log roll, mod assist to bring LE's off EOB and to raise trunk upright. Mod +2 to control return to supine and lift LE's onto bed secondary to pain.    Transfers Overall transfer level: Needs assistance Equipment used: Rolling walker (2 wheels) Transfers: Sit to/from Stand Sit to Stand: Min assist           General transfer comment: VC's for safe hand placement on RW, assist to powerup and steady once standing.    Ambulation/Gait Ambulation/Gait assistance: Min assist Gait Distance (Feet): 12 Feet Assistive device: Rolling walker (2 wheels) Gait Pattern/deviations: Step-through pattern, Decreased stride length, Drifts right/left, Narrow base of support Gait velocity: decr     General Gait Details: Cues to maintain closer proximity to  RW, pt with sligth tremor on Rt LE when initiating gait but it improved as she ambulated short  distance around bed.  Stairs            Wheelchair Mobility    Modified Rankin (Stroke Patients Only)       Balance Overall balance assessment: Needs assistance Sitting-balance support: Feet supported, Bilateral upper extremity supported Sitting balance-Leahy Scale: Fair     Standing balance support: Reliant on assistive device for balance, During functional activity, Bilateral upper extremity supported Standing balance-Leahy Scale: Poor                               Pertinent Vitals/Pain Pain Assessment Pain Assessment: Faces Faces Pain Scale: Hurts whole lot Pain Location: back with bed mobility Pain Descriptors / Indicators: Aching, Discomfort Pain Intervention(s): Limited activity within patient's tolerance, Monitored during session, Repositioned    Home Living Family/patient expects to be discharged to:: Private residence Living Arrangements: Children Available Help at Discharge: Family;Available 24 hours/day (both adults dtrs are at home, looking into private duty aid) Type of Home: House Home Access: Stairs to enter Entrance Stairs-Rails: Psychiatric nurse of Steps: 5 Alternate Level Stairs-Number of Steps: chair lift Home Layout: Two level Home Equipment: Shower seat - built in;Rollator (4 wheels) Additional Comments: kids just ordered ahospital bed for her to go downstairs, her bed is too high she has to use a step stool    Prior Function               Mobility Comments: uses rollator all the time ADLs Comments: independent prior to fall a couple of days ago,     Hand Dominance   Dominant Hand: Right    Extremity/Trunk Assessment   Upper Extremity Assessment Upper Extremity Assessment: Overall WFL for tasks assessed (ability to resist limited due to back pain. reports intermittent tremors that effect coordination.)    Lower Extremity Assessment Lower Extremity Assessment: Defer to PT evaluation    Cervical /  Trunk Assessment Cervical / Trunk Assessment: Other exceptions Cervical / Trunk Exceptions: L2 comp fx  Communication   Communication: No difficulties  Cognition Arousal/Alertness: Awake/alert Behavior During Therapy: WFL for tasks assessed/performed Overall Cognitive Status: Within Functional Limits for tasks assessed                                          General Comments      Exercises     Assessment/Plan    PT Assessment Patient needs continued PT services  PT Problem List Decreased strength;Decreased range of motion;Decreased activity tolerance;Decreased balance;Decreased mobility;Decreased knowledge of use of DME;Decreased safety awareness;Pain;Decreased knowledge of precautions       PT Treatment Interventions DME instruction;Gait training;Stair training;Functional mobility training;Therapeutic activities;Therapeutic exercise;Balance training;Neuromuscular re-education;Patient/family education    PT Goals (Current goals can be found in the Care Plan section)  Acute Rehab PT Goals Patient Stated Goal: back to stop hurting PT Goal Formulation: With patient Time For Goal Achievement: 08/01/22 Potential to Achieve Goals: Good    Frequency Min 2X/week     Co-evaluation               AM-PAC PT "6 Clicks" Mobility  Outcome Measure Help needed turning from your back to your side while in a flat bed without using bedrails?: A Little Help needed moving from  lying on your back to sitting on the side of a flat bed without using bedrails?: A Lot Help needed moving to and from a bed to a chair (including a wheelchair)?: A Little Help needed standing up from a chair using your arms (e.g., wheelchair or bedside chair)?: A Little Help needed to walk in hospital room?: A Little Help needed climbing 3-5 steps with a railing? : A Lot 6 Click Score: 16    End of Session Equipment Utilized During Treatment: Gait belt;Back brace Activity Tolerance: Patient  tolerated treatment well;Patient limited by pain Patient left: in bed;with call bell/phone within reach Nurse Communication: Mobility status PT Visit Diagnosis: Unsteadiness on feet (R26.81);Muscle weakness (generalized) (M62.81);Difficulty in walking, not elsewhere classified (R26.2)    Time: 9021-1155 PT Time Calculation (min) (ACUTE ONLY): 29 min   Charges:   PT Evaluation $PT Eval Low Complexity: 1 Low          Verner Mould, DPT Acute Rehabilitation Services Office [phone] Pager [phone]  07/18/22 12:16 PM

## 2022-07-18 NOTE — NC FL2 (Signed)
[place] LEVEL OF CARE SCREENING TOOL     IDENTIFICATION  Patient Name: Cynthia Ho Birthdate: 1936/09/24 Sex: female Admission Date (Current Location): 07/17/2022  [facility] and [place] Number:  Herbalist and Address:  [facility],  [place], [place]      Provider Number: [id number]  Attending Physician Name and Address:  Aline August, MD  Relative Name and Phone Number:  Roselind Messier (daughter) Ph: [phone]    Current Level of Care: Hospital Recommended Level of Care: [place] Prior Approval Number:    Date Approved/Denied:   PASRR Number: [id number]  Discharge Plan: SNF    Current Diagnoses: Patient Active Problem List   Diagnosis Date Noted   Hypokalemia 07/18/2022   Lumbar compression fracture ([place]) 07/17/2022   Anemia 07/17/2022   History of CVA (cerebrovascular accident) 07/17/2022   Hypothyroidism 07/17/2022   UTI (urinary tract infection) 07/17/2022   Major neurocognitive disorder ([place]) 09/20/2021   Carotid stenosis, asymptomatic, bilateral 10/27/2020   Peripheral vascular disease ([place]) 10/27/2020   Other pulmonary embolism without acute cor pulmonale ([place]) 01/12/2020   Malignant neoplasm of upper-outer quadrant of right female breast ([place]) 06/20/2015    Orientation RESPIRATION BLADDER Height & Weight     Self, Time, Situation, Place  Normal Continent Weight: 149 lb 14.6 oz (68 kg) Height:  '5\' 5"'$  (165.1 cm)  BEHAVIORAL SYMPTOMS/MOOD NEUROLOGICAL BOWEL NUTRITION STATUS   (N/A)  (N/A) Continent Diet (Regular diet)  AMBULATORY STATUS COMMUNICATION OF NEEDS Skin   Limited Assist Verbally Normal                       Personal Care Assistance Level of Assistance  Bathing, Feeding, Dressing Bathing Assistance: Maximum assistance Feeding assistance: Independent Dressing Assistance: Maximum assistance     Functional Limitations Info  Sight, Hearing, Speech Sight Info:  Adequate Hearing Info: Adequate Speech Info: Adequate    SPECIAL CARE FACTORS FREQUENCY  PT (By licensed PT), OT (By licensed OT)     PT Frequency: 5x's/week OT Frequency: 5x's/week            Contractures Contractures Info: Not present    Additional Factors Info  Code Status, Allergies Code Status Info: Full Allergies Info: NKA           Current Medications (07/18/2022):  This is the current hospital active medication list Current Facility-Administered Medications  Medication Dose Route Frequency Provider Last Rate Last Admin   acetaminophen (TYLENOL) tablet 650 mg  650 mg Oral Q6H PRN Marcelyn Bruins, MD       Or   acetaminophen (TYLENOL) suppository 650 mg  650 mg Rectal Q6H PRN Marcelyn Bruins, MD       aspirin EC tablet 81 mg  81 mg Oral QHS Marcelyn Bruins, MD   81 mg at 07/17/22 2354   diclofenac Sodium (VOLTAREN) 1 % topical gel 4 g  4 g Topical QID PRN Marcelyn Bruins, MD       enoxaparin (LOVENOX) injection 40 mg  40 mg Subcutaneous Q24H Marcelyn Bruins, MD   40 mg at 07/18/22 1009   ketorolac (TORADOL) 15 MG/ML injection 15 mg  15 mg Intravenous Q6H PRN Marcelyn Bruins, MD   15 mg at 07/18/22 0750   levothyroxine (SYNTHROID) tablet 88 mcg  88 mcg Oral Q0600 Marcelyn Bruins, MD   88 mcg at 07/18/22 0505   lidocaine (LIDODERM) 5 % 1 patch  1 patch Transdermal Q24H Aline August, MD   1 patch at 07/18/22 1103   nitrofurantoin (macrocrystal-monohydrate) (MACROBID) capsule 100 mg  100 mg Oral BID Marcelyn Bruins, MD   100 mg at 07/18/22 1009   oxyCODONE-acetaminophen (PERCOCET/ROXICET) 5-325 MG per tablet 1 tablet  1 tablet Oral Q6H PRN Marcelyn Bruins, MD   1 tablet at 07/18/22 1242   polyethylene glycol (MIRALAX / GLYCOLAX) packet 17 g  17 g Oral Daily PRN Marcelyn Bruins, MD       sodium chloride flush (NS) 0.9 % injection 3 mL  3 mL Intravenous Q12H Marcelyn Bruins, MD   3 mL at 07/18/22 0511     Discharge  Medications: Please see discharge summary for a list of discharge medications.  Relevant Imaging Results:  Relevant Lab Results:   Additional Information SSN: [id number]  Sherie Don, LCSW

## 2022-07-18 NOTE — TOC Initial Note (Signed)
Transition of Care [facility]) - Initial/Assessment Note   Patient Details  Name: Cynthia Ho MRN: [id number] Date of Birth: 12-16-1936  [facility] CM/SW Contact:    Sherie Don, LCSW Phone Number: 07/18/2022, 3:15 PM  Clinical Narrative: PT and OT evaluations recommended SNF. Patient is agreeable to being referred out. CSW recommended that patient update her family regarding recommendations.  FL2 done; PASRR confirmed. Initial referral faxed out. TOC awaiting bed offers.  Expected Discharge Plan: Skilled Nursing Facility Barriers to Discharge: SNF Pending bed offer, Insurance Authorization  Patient Goals and CMS Choice Patient states their goals for this hospitalization and ongoing recovery are:: Go to short-term rehab Choice offered to / list presented to : Patient  Expected Discharge Plan and Services Expected Discharge Plan: [place] In-house Referral: Clinical Social Work Post Acute Care Choice: [place] Living arrangements for the past 2 months: [place]            DME Arranged: N/A DME Agency: NA  Prior Living Arrangements/Services Living arrangements for the past 2 months: [place] Patient language and need for interpreter reviewed:: Yes Do you feel safe going back to the place where you live?: Yes      Need for Family Participation in Patient Care: No (Comment) Care giver support system in place?: Yes (comment) Criminal Activity/Legal Involvement Pertinent to Current Situation/Hospitalization: No - Comment as needed  Activities of Daily Living Home Assistive Devices/Equipment: [place]ford Rile (specify type) ADL Screening (condition at time of admission) Patient's cognitive ability adequate to safely complete daily activities?: Yes Is the patient deaf or have difficulty hearing?: No Does the patient have difficulty seeing, even when wearing glasses/contacts?: No Does the patient have difficulty concentrating,  remembering, or making decisions?: No Patient able to express need for assistance with ADLs?: Yes Does the patient have difficulty dressing or bathing?: No Independently performs ADLs?: Yes (appropriate for developmental age) Does the patient have difficulty walking or climbing stairs?: Yes Weakness of Legs: Both Weakness of Arms/Hands: None  Permission Sought/Granted Permission sought to share information with : Facility Art therapist granted to share information with : Yes, Verbal Permission Granted Permission granted to share info w AGENCY: SNFs  Emotional Assessment Attitude/Demeanor/Rapport: Engaged Affect (typically observed): Accepting Orientation: : Oriented to Self, Oriented to Place, Oriented to  Time, Oriented to Situation Alcohol / Substance Use: Not Applicable Psych Involvement: No (comment)  Admission diagnosis:  Lumbar compression fracture ([place]) [S32.000A] Closed compression fracture of L2 lumbar vertebra, initial encounter ([place]) [S32.020A] Patient Active Problem List   Diagnosis Date Noted   Hypokalemia 07/18/2022   Lumbar compression fracture ([place]) 07/17/2022   Anemia 07/17/2022   History of CVA (cerebrovascular accident) 07/17/2022   Hypothyroidism 07/17/2022   UTI (urinary tract infection) 07/17/2022   Major neurocognitive disorder ([place]) 09/20/2021   Carotid stenosis, asymptomatic, bilateral 10/27/2020   Peripheral vascular disease ([place]) 10/27/2020   Other pulmonary embolism without acute cor pulmonale ([place]) 01/12/2020   Malignant neoplasm of upper-outer quadrant of right female breast ([place]) 06/20/2015   PCP:  [place] Spanish, MD Pharmacy:   [facility] [id number] - [place], [address] [place] [address] [place]  [address] Phone: [phone] Fax: [phone]  Readmission Risk Interventions     No data to display

## 2022-07-18 NOTE — Progress Notes (Signed)
Pt has arrived to 1336 from ED with back pain.  Report accepted from [facility].  Pt is alert and oriented.  Room orientation completed with call bell placed at bedside.  Will continue to monitor pt.

## 2022-07-18 NOTE — ED Notes (Signed)
Pt. Refused lunch tray

## 2022-07-18 NOTE — Evaluation (Signed)
Occupational Therapy Evaluation Patient Details Name: Cynthia Ho MRN: [id number] DOB: Jan 25, 1936 Today's Date: 07/18/2022   History of Present Illness 86 y.o. female with medical history significant of breast cancer, DVT, PE, CVA, carotid artery disease, neurocognitive disease, PVD presented with worsening back pain after a fall 3 days prior to presentation.  CT of the lumbar spine showed acute compression fracture at L2 with 20% height loss and retropulsion along with degenerative changes at L4-L5 with moderate to severe spinal stenosis.  Neurosurgery recommended conservative management with lumbar corset and outpatient follow-up and admission with PT and OT evaluations if unable to ambulate.  Patient was unable to ambulate in the ED.   Clinical Impression   [name] is an 86 year old woman typically independent and ambulatory with rollator prior to fall no presents with pain, poor activity tolerance, decreased strength and impaired balance. She reports intermittent tremors in her arms and legs at baseline. On evaluation patient needing mod-max assist for bed transfers, mod-max assist for LB ADLs and toileting and minimal tolerance for ambulation. Patient will benefit from skilled OT services while in hospital to improve deficits and learn compensatory strategies as needed in order to return to PLOF.  Recommend short term rehab at discharge.       Recommendations for follow up therapy are one component of a multi-disciplinary discharge planning process, led by the attending physician.  Recommendations may be updated based on patient status, additional functional criteria and insurance authorization.   Follow Up Recommendations  Skilled nursing-short term rehab (<3 hours/day)    Assistance Recommended at Discharge Frequent or constant Supervision/Assistance  Patient can return home with the following A little help with walking and/or transfers;A lot of help with  bathing/dressing/bathroom;Assistance with cooking/housework;Help with stairs or ramp for entrance    Functional Status Assessment  Patient has had a recent decline in their functional status and demonstrates the ability to make significant improvements in function in a reasonable and predictable amount of time.  Equipment Recommendations  None recommended by OT (TBD)    Recommendations for Other Services       Precautions / Restrictions Precautions Precautions: Fall Precaution Comments: fallen at least 6 times in last couple years. this time fell back and hit the minifridge Restrictions Weight Bearing Restrictions: No      Mobility Bed Mobility Overal bed mobility: Needs Assistance Bed Mobility: Supine to Sit, Sit to Supine     Supine to sit: Mod assist, HOB elevated, +2 for safety/equipment, Max assist Sit to supine: Mod assist, +2 for safety/equipment, HOB elevated   General bed mobility comments: Assistance for trunk mostly in supine to sit with instruction on log roll technqiue. mod assist for return to supine for LEs.    Transfers Overall transfer level: Needs assistance Equipment used: Rolling walker (2 wheels) Transfers: Sit to/from Stand, Bed to chair/wheelchair/BSC Sit to Stand: Min assist           General transfer comment: Min assist to ambulate around stretcher with RW. Mild tremor in RLE.      Balance Overall balance assessment: Needs assistance Sitting-balance support: No upper extremity supported, Feet supported Sitting balance-Leahy Scale: Fair Sitting balance - Comments: Using UB strength to support secondary to pain   Standing balance support: Reliant on assistive device for balance, During functional activity Standing balance-Leahy Scale: Poor  ADL either performed or assessed with clinical judgement   ADL Overall ADL's : Needs assistance/impaired Eating/Feeding: Set up;Sitting   Grooming: Set  up;Sitting   Upper Body Bathing: Set up;Sitting;Minimal assistance   Lower Body Bathing: Moderate assistance;Sit to/from stand   Upper Body Dressing : Minimal assistance;Sitting   Lower Body Dressing: Maximal assistance;Sit to/from stand   Toilet Transfer: Minimal assistance;BSC/3in1;Rolling walker (2 wheels)   Toileting- Clothing Manipulation and Hygiene: Maximal assistance;Sit to/from stand       Functional mobility during ADLs: Minimal assistance;Rolling walker (2 wheels)       Vision Patient Visual Report: No change from baseline       Perception     Praxis      Pertinent Vitals/Pain Pain Assessment Pain Assessment: Faces Faces Pain Scale: Hurts whole lot Pain Location: Low back Pain Descriptors / Indicators: Grimacing, Aching, Guarding, Sharp Pain Intervention(s): Monitored during session, Premedicated before session     Hand Dominance Right   Extremity/Trunk Assessment Upper Extremity Assessment Upper Extremity Assessment: Overall WFL for tasks assessed (ability to resist limited due to back pain. reports intermittent tremors that effect coordination.)   Lower Extremity Assessment Lower Extremity Assessment: Defer to PT evaluation   Cervical / Trunk Assessment Cervical / Trunk Assessment: Other exceptions Cervical / Trunk Exceptions: L2 comp fx   Communication Communication Communication: No difficulties   Cognition Arousal/Alertness: Awake/alert Behavior During Therapy: WFL for tasks assessed/performed Overall Cognitive Status: Within Functional Limits for tasks assessed                                       General Comments       Exercises     Shoulder Instructions      Home Living Family/patient expects to be discharged to:: Private residence Living Arrangements: Children Available Help at Discharge: Family;Available 24 hours/day (both adults dtrs are at home, looking into private duty aid) Type of Home: House Home  Access: Stairs to enter [employer] of Steps: 5 Entrance Stairs-Rails: Right;Left Home Layout: Two level Alternate Level Stairs-Number of Steps: chair lift   Bathroom Shower/Tub: Occupational psychologist: Handicapped height Bathroom Accessibility: Yes   Home Equipment: Shower seat - built in;Rollator (4 wheels)   Additional Comments: kids just ordered ahospital bed for her to go downstairs, her bed is too high she has to use a step stool      Prior Functioning/Environment               Mobility Comments: uses rollator all the time ADLs Comments: independent prior to fall a couple of days ago,        OT Problem List: Decreased activity tolerance;Decreased strength;Impaired balance (sitting and/or standing);Decreased safety awareness;Decreased knowledge of use of DME or AE;Decreased knowledge of precautions;Pain      OT Treatment/Interventions: Self-care/ADL training;Therapeutic exercise;DME and/or AE instruction;Therapeutic activities;Balance training;Patient/family education    OT Goals(Current goals can be found in the care plan section) Acute Rehab OT Goals Patient Stated Goal: less pain OT Goal Formulation: With patient Time For Goal Achievement: 08/01/22 Potential to Achieve Goals: Good  OT Frequency: Min 2X/week    Co-evaluation PT/OT/SLP Co-Evaluation/Treatment: Yes (co-eval)            AM-PAC OT "6 Clicks" Daily Activity     Outcome Measure Help from another person eating meals?: A Little Help from another person taking care of personal grooming?: A  Little Help from another person toileting, which includes using toliet, bedpan, or urinal?: A Lot Help from another person bathing (including washing, rinsing, drying)?: A Lot Help from another person to put on and taking off regular upper body clothing?: A Little Help from another person to put on and taking off regular lower body clothing?: A Lot 6 Click Score: 15   End of Session  Equipment Utilized During Treatment: Gait belt;Rolling walker (2 wheels)  Activity Tolerance: Patient limited by pain Patient left: in bed;with call bell/phone within reach  OT Visit Diagnosis: Pain                Time: 9201-0071 OT Time Calculation (min): 27 min Charges:  OT General Charges $OT Visit: 1 Visit OT Evaluation $OT Eval Low Complexity: 1 Low  Aneira Cavitt, OTR/L [place]  Office [phone] Pager: [place] 07/18/2022, 10:46 AM

## 2022-07-19 DIAGNOSIS — Z8673 Personal history of transient ischemic attack (TIA), and cerebral infarction without residual deficits: Secondary | ICD-10-CM | POA: Diagnosis not present

## 2022-07-19 DIAGNOSIS — E876 Hypokalemia: Secondary | ICD-10-CM

## 2022-07-19 DIAGNOSIS — S32020A Wedge compression fracture of second lumbar vertebra, initial encounter for closed fracture: Secondary | ICD-10-CM | POA: Diagnosis not present

## 2022-07-19 DIAGNOSIS — E039 Hypothyroidism, unspecified: Secondary | ICD-10-CM | POA: Diagnosis not present

## 2022-07-19 LAB — BASIC METABOLIC PANEL
Anion gap: 9 (ref 5–15)
BUN: 14 mg/dL (ref 8–23)
CO2: 24 mmol/L (ref 22–32)
Calcium: 8.6 mg/dL — ABNORMAL LOW (ref 8.9–10.3)
Chloride: 110 mmol/L (ref 98–111)
Creatinine, Ser: 0.73 mg/dL (ref 0.44–1.00)
GFR, Estimated: >60 mL/min (ref >60)
Glucose, Bld: 104 mg/dL — ABNORMAL HIGH (ref 70–99)
Potassium: 4.1 mmol/L (ref 3.5–5.1)
Sodium: 143 mmol/L (ref 135–145)

## 2022-07-19 LAB — MAGNESIUM: Magnesium: 2.1 mg/dL (ref 1.7–2.4)

## 2022-07-19 MED ORDER — SENNA 8.6 MG PO TABS: 1.0000 | ORAL_TABLET | Freq: Two times a day (BID) | ORAL | 0 refills | Status: AC

## 2022-07-19 MED ORDER — POLYETHYLENE GLYCOL 3350 17 G PO PACK
17.0000 g | PACK | Freq: Every day | ORAL | 0 refills | Status: AC | PRN
Start: 2022-07-19 — End: ?

## 2022-07-19 MED ORDER — OXYCODONE-ACETAMINOPHEN 5-325 MG PO TABS: 1.0000 | ORAL_TABLET | Freq: Four times a day (QID) | ORAL | 0 refills | Status: AC | PRN

## 2022-07-19 MED ORDER — LIDOCAINE 5 % EX PTCH: 1.0000 | MEDICATED_PATCH | CUTANEOUS | 0 refills | Status: AC

## 2022-07-19 MED ORDER — OYSTER SHELL CALCIUM/D3 500-5 MG-MCG PO TABS: 1.0000 | ORAL_TABLET | Freq: Two times a day (BID) | ORAL | 0 refills | Status: AC

## 2022-07-19 NOTE — Discharge Summary (Signed)
Physician Discharge Summary  Aylin Rhoads MGQ:[id number] DOB: [birth year] DOA: 07/17/2022  PCP: Guadlupe Spanish, MD  Admit date: 07/17/2022 Discharge date: 07/19/2022  Admitted From: Home Disposition: SNF  Recommendations for Outpatient Follow-up:  Follow up with SNF provider at earliest convenience Outpatient follow-up with neurosurgery joints Follow up in ED if symptoms worsen or new appear   Home Health: No Equipment/Devices: None  Discharge Condition: Stable CODE STATUS: Full Diet recommendation: Regular  Brief/Interim Summary: 86 y.o. female with medical history significant of breast cancer, DVT, PE, CVA, carotid artery disease, neurocognitive disease, PVD presented with worsening back pain after a fall 3 days prior to presentation.  On presentation CT renal stone study showed no evidence of obstructing renal stones or hydronephrosis.  CT of the lumbar spine showed acute compression fracture at L2 with 20% height loss and retropulsion along with degenerative changes at L4-L5 with moderate to severe spinal stenosis.  Neurosurgery recommended conservative management with lumbar corset and outpatient follow-up and admission with PT and OT evaluations if unable to ambulate.  Patient was unable to ambulate in the ED. subsequently, PT recommended SNF placement.  She will be discharged to SNF once bed is available.  Discharge Diagnoses:   Lumbar compression fracture Severe lower back pain Ambulatory dysfunction -Presented with worsening lower back pain.  CT imaging as above. -Case discussed by EDP with neurosurgery on-call on 07/17/2022: Neurosurgery recommended conservative management with lumbar corset and outpatient follow-up and admission with PT and OT evaluations if unable to ambulate.  Patient was unable to ambulate in the ED. -PT recommending SNF placement.   -Continue lumbar corset. -Discharge to SNF once bed is available.  Outpatient follow-up with neurosurgery/Dr. Sherley Bounds   History of unspecified CVA -Continue home aspirin   Hypokalemia -Resolved  Hypothyroidism -Continue levothyroxine  Recent diagnosis of UTI as an outpatient -Was on Macrobid as an outpatient.  Continue the same to finish course.   Microcytic anemia -Questionable cause.  Hemoglobin stable.  Outpatient follow-up.   Thrombocytosis -Most likely reactive.  Outpatient follow-up   Discharge Instructions  Discharge Instructions     Diet general   Complete by: As directed    Increase activity slowly   Complete by: As directed       Allergies as of 07/19/2022   No Known Allergies      Medication List     TAKE these medications    Bayer Low Dose 81 MG tablet Generic drug: aspirin EC Take 81 mg by mouth at bedtime. Swallow whole.   calcium-vitamin D 500-5 MG-MCG tablet Commonly known as: OSCAL WITH D Take 1 tablet by mouth 2 (two) times daily.   diclofenac Sodium 1 % Gel Commonly known as: Voltaren Apply 4 g topically 4 (four) times daily as needed. What changed: reasons to take this   levothyroxine 88 MCG tablet Commonly known as: SYNTHROID Take 88 mcg by mouth daily before breakfast.   lidocaine 5 % Commonly known as: LIDODERM Place 1 patch onto the skin daily. Remove & Discard patch within 12 hours or as directed by MD Start taking on: July 20, 2022   nitrofurantoin (macrocrystal-monohydrate) 100 MG capsule Commonly known as: Macrobid Take 1 capsule (100 mg total) by mouth 2 (two) times daily for 7 days.   ONE-A-DAY WOMENS PRENATAL 1 PO Take 1 capsule by mouth daily with breakfast.   oxyCODONE-acetaminophen 5-325 MG tablet Commonly known as: PERCOCET/ROXICET Take 1 tablet by mouth every 6 (six) hours as needed for moderate pain or  severe pain.   polyethylene glycol 17 g packet Commonly known as: MIRALAX / GLYCOLAX Take 17 g by mouth daily as needed for mild constipation.   senna 8.6 MG Tabs tablet Commonly known as: SENOKOT Take 1 tablet  (8.6 mg total) by mouth 2 (two) times daily.        Follow-up Information     Eustace Moore, MD. Schedule an appointment as soon as possible for a visit in 1 week(s).   Specialty: Neurosurgery Contact information: [address] [place] [address] [phone]                No Known Allergies  Consultations: Case discussed by EDP with neurosurgery on-call on 07/17/2022   Procedures/Studies: CT L-SPINE NO CHARGE  Result Date: 07/17/2022 CLINICAL DATA:  Initial evaluation for acute trauma, fall. EXAM: CT LUMBAR SPINE WITHOUT CONTRAST TECHNIQUE: Multidetector CT imaging of the lumbar spine was performed without intravenous contrast administration. Multiplanar CT image reconstructions were also generated. RADIATION DOSE REDUCTION: This exam was performed according to the departmental dose-optimization program which includes automated exposure control, adjustment of the mA and/or kV according to patient size and/or use of iterative reconstruction technique. COMPARISON:  Radiograph from 07/14/2022. FINDINGS: Segmentation: Standard. Lowest well-formed disc space labeled the L5-S1 level. Alignment: 5 mm facet mediated anterolisthesis of L4 on L5. Underlying levoscoliosis. Vertebrae: Acute appearing compression fracture involving the superior endplate of L2 with mild 20% height loss and 3 mm bony retropulsion. Otherwise, vertebral body height maintained with no other acute or chronic fracture. Visualized sacrum and pelvis intact. SI joints symmetric and within normal limits. No worrisome osseous lesions. Paraspinal and other soft tissues: Mild edema seen adjacent to the acute L2 compression fracture. Paraspinous soft tissues demonstrate no other acute finding. Advanced aorto bi-iliac atherosclerotic disease. Few scattered simple renal cysts noted, benign in appearance, no follow-up imaging recommended. Right renal nephrolithiasis. Colonic diverticulosis noted. Disc levels:  L1-2: 3 mm bony retropulsion related to the L2 compression fracture. Mild facet hypertrophy. No more than mild spinal stenosis. Foramina remain patent. L2-3: Hypertrophy diffuse disc bulge, asymmetric to the right. Mild facet hypertrophy. No significant spinal stenosis. Mild bilateral foraminal narrowing. L3-4: Mild disc bulge with superimposed right subarticular to foraminal disc protrusion (series 505, image 65). Mild facet hypertrophy. Resultant mild narrowing of the right lateral recess. Central canal remains patent. Mild right L3 foraminal stenosis. L4-5: Anterolisthesis. Associated broad posterior pseudo disc bulge/uncovering. Moderate to severe bilateral facet arthrosis, worse on the right. Resultant moderate to severe canal with bilateral subarticular stenosis. Moderate right worse than left L4 foraminal narrowing. L5-S1: Disc bulge with intervertebral disc space narrowing and mild reactive endplate spurring. Moderate facet hypertrophy. No significant spinal stenosis. Moderate bilateral L5 foraminal narrowing. IMPRESSION: 1. Acute compression fracture involving the superior endplate of L2 with mild 20% height loss and 3 mm bony retropulsion. No more than mild spinal stenosis at this level. 2. Multifactorial degenerative changes at L4-5 with resultant moderate to severe canal and bilateral subarticular stenosis, with moderate bilateral L4 foraminal narrowing. 3. Nonobstructive right renal nephrolithiasis. 4. Colonic diverticulosis. 5. Aortic Atherosclerosis (ICD10-I70.0). Electronically Signed   By: Jeannine Boga M.D.   On: 07/17/2022 23:02   CT Renal Stone Study  Result Date: 07/17/2022 CLINICAL DATA:  Flank pain after fall. EXAM: CT ABDOMEN AND PELVIS WITHOUT CONTRAST TECHNIQUE: Multidetector CT imaging of the abdomen and pelvis was performed following the standard protocol without IV contrast. RADIATION DOSE REDUCTION: This exam was performed according  to the departmental dose-optimization  program which includes automated exposure control, adjustment of the mA and/or kV according to patient size and/or use of iterative reconstruction technique. COMPARISON:  July 14, 2022. FINDINGS: Lower chest: No acute abnormality. Hepatobiliary: No focal liver abnormality is seen. No gallstones, gallbladder wall thickening, or biliary dilatation. Pancreas: Unremarkable. No pancreatic ductal dilatation or surrounding inflammatory changes. Spleen: Normal in size without focal abnormality. Adrenals/Urinary Tract: Adrenal glands appear normal. Probable small right renal calculus is noted. No hydronephrosis is noted. Bilateral parapelvic cysts are noted. Urinary bladder is unremarkable. Stomach/Bowel: Small sliding-type hiatal hernia. The stomach otherwise appears normal. There is no evidence of bowel obstruction or inflammation. Sigmoid diverticulosis is noted without inflammation. Status post appendectomy. Vascular/Lymphatic: Aortic atherosclerosis. No enlarged abdominal or pelvic lymph nodes. Reproductive: Status post hysterectomy. No adnexal masses. Other: No abdominal wall hernia or abnormality. No abdominopelvic ascites. Musculoskeletal: Mild compression deformity of L2 vertebral body is again noted consistent with acute to subacute fracture. IMPRESSION: Probable small nonobstructive right renal calculus. No hydronephrosis or renal obstruction is noted. Sigmoid diverticulosis without inflammation. Mild compression deformity of L2 vertebral body is again noted consistent with acute to subacute fracture. Small sliding-type hiatal hernia. Aortic Atherosclerosis (ICD10-I70.0). Electronically Signed   By: Marijo Conception M.D.   On: 07/17/2022 21:27   DG Lumbar Spine Complete  Result Date: 07/14/2022 CLINICAL DATA:  Lower back pain after fall. EXAM: LUMBAR SPINE - COMPLETE 4+ VIEW COMPARISON:  Lumbar spine radiographs 05/31/2019; CT chest 05/28/2022 FINDINGS: There are 5 non-rib-bearing lumbar-type vertebral bodies.  Minimal levocurvature centered at L3-4. 4 mm grade 1 anterolisthesis of L4 on L5. Minimal superior L2 endplate concavity is mildly worsened from 05/31/2019 lumbar spine radiographs. Unfortunately the available 05/28/2022 CT only minimally images a portion of the superior L2 vertebral endplate. This compression fracture remains age indeterminate. Mild anterior L1-2 endplate osteophytes. Minimal posterior L5-S1 disc space narrowing. Moderate atherosclerotic calcifications within the aorta and iliac arteries. IMPRESSION: 1. Mild superior L2 endplate compression fracture is mildly worsened from 05/31/2019. No more recent imaging is available to further assess acuity. This remains age indeterminate. Recommend clinical correlation for point tenderness. 2. Mild grade 1 anterolisthesis of L4 on L5 is unchanged. Electronically Signed   By: Yvonne Kendall M.D.   On: 07/14/2022 17:40      Subjective: Patient seen and examined at bedside.  No overnight vomiting, worsening abdominal pain, fever, cough reported.  Still complains of intermittent back pain.  Discharge Exam: Vitals:   07/19/22 0138 07/19/22 0559  BP: (!) 155/67 139/66  Pulse: 61 62  Resp: 17 16  Temp: 98.3 F (36.8 C) 98.2 F (36.8 C)  SpO2: 100% 98%    General: On room air.  No distress.  respiratory: Decreased breath sounds at bases bilaterally with some crackles CVS: Currently rate controlled; S1-S2 heard  abdominal: Soft, nontender, slightly distended, no organomegaly;  bowel sounds are heard  extremities: Trace lower extremity edema; no clubbing.        The results of significant diagnostics from this hospitalization (including imaging, microbiology, ancillary and laboratory) are listed below for reference.     Microbiology: No results found for this or any previous visit (from the past 240 hour(s)).   Labs: BNP (last 3 results) Recent Labs    05/04/22 0745  BNP 53.2   Basic Metabolic Panel: Recent Labs  Lab  07/14/22 1800 07/17/22 2057 07/18/22 0500 07/19/22 0340  NA 139 144 144 143  K 3.7 3.6 3.4* 4.1  CL 104 110 112* 110  CO2 '27 27 25 24  '$ GLUCOSE 116* 114* 103* 104*  BUN '17 19 18 14  '$ CREATININE 1.09* 1.07* 0.83 0.73  CALCIUM 8.8* 8.9 8.5* 8.6*  MG  --   --   --  2.1   Liver Function Tests: Recent Labs  Lab 07/17/22 2057  AST 17  ALT 12  ALKPHOS 57  BILITOT 0.5  PROT 6.4*  ALBUMIN 3.3*   No results for input(s): "LIPASE", "AMYLASE" in the last 168 hours. No results for input(s): "AMMONIA" in the last 168 hours. CBC: Recent Labs  Lab 07/14/22 1800 07/17/22 2057 07/18/22 0500  WBC 6.9 6.4 5.8  NEUTROABS 5.9 3.9  --   HGB 9.6* 9.7* 8.8*  HCT 32.6* 34.3* 30.6*  MCV 66.8* 68.5* 68.8*  PLT 471* 520* 470*   Cardiac Enzymes: No results for input(s): "CKTOTAL", "CKMB", "CKMBINDEX", "TROPONINI" in the last 168 hours. BNP: Invalid input(s): "POCBNP" CBG: No results for input(s): "GLUCAP" in the last 168 hours. D-Dimer No results for input(s): "DDIMER" in the last 72 hours. Hgb A1c No results for input(s): "HGBA1C" in the last 72 hours. Lipid Profile No results for input(s): "CHOL", "HDL", "LDLCALC", "TRIG", "CHOLHDL", "LDLDIRECT" in the last 72 hours. Thyroid function studies No results for input(s): "TSH", "T4TOTAL", "T3FREE", "THYROIDAB" in the last 72 hours.  Invalid input(s): "FREET3" Anemia work up No results for input(s): "VITAMINB12", "FOLATE", "FERRITIN", "TIBC", "IRON", "RETICCTPCT" in the last 72 hours. Urinalysis    Component Value Date/Time   COLORURINE YELLOW 07/14/2022 1813   APPEARANCEUR CLOUDY (A) 07/14/2022 1813   LABSPEC 1.020 07/14/2022 1813   PHURINE 6.0 07/14/2022 1813   GLUCOSEU NEGATIVE 07/14/2022 1813   HGBUR SMALL (A) 07/14/2022 1813   BILIRUBINUR NEGATIVE 07/14/2022 1813   KETONESUR NEGATIVE 07/14/2022 1813   PROTEINUR NEGATIVE 07/14/2022 1813   NITRITE POSITIVE (A) 07/14/2022 1813   LEUKOCYTESUR LARGE (A) 07/14/2022 1813   Sepsis  Labs Recent Labs  Lab 07/14/22 1800 07/17/22 2057 07/18/22 0500  WBC 6.9 6.4 5.8   Microbiology No results found for this or any previous visit (from the past 240 hour(s)).   Time coordinating discharge: 35 minutes  SIGNED:   Aline August, MD  [facility] 07/19/2022, 9:40 AM

## 2022-07-19 NOTE — Progress Notes (Signed)
PROGRESS NOTE    Cynthia Ho  VVO:[id number] DOB: Sep 07, 1936 DOA: 07/17/2022 PCP: Guadlupe Spanish, MD   Brief Narrative:   86 y.o. female with medical history significant of breast cancer, DVT, PE, CVA, carotid artery disease, neurocognitive disease, PVD presented with worsening back pain after a fall 3 days prior to presentation.  On presentation CT renal stone study showed no evidence of obstructing renal stones or hydronephrosis.  CT of the lumbar spine showed acute compression fracture at L2 with 20% height loss and retropulsion along with degenerative changes at L4-L5 with moderate to severe spinal stenosis.  Neurosurgery recommended conservative management with lumbar corset and outpatient follow-up and admission with PT and OT evaluations if unable to ambulate.  Patient was unable to ambulate in the ED. subsequently, PT recommended SNF placement.  Assessment & Plan:   Lumbar compression fracture Severe lower back pain Ambulatory dysfunction -Presented with worsening lower back pain.  CT imaging as above. -Case discussed by EDP with neurosurgery on-call on 07/17/2022: Neurosurgery recommended conservative management with lumbar corset and outpatient follow-up and admission with PT and OT evaluations if unable to ambulate.  Patient was unable to ambulate in the ED. -PT recommending SNF placement.  Social worker consulted.  Pain management.  Fall precautions. -Continue lumbar corset.  History of unspecified CVA -Continue home aspirin  Hypokalemia -Resolved  Hypothyroidism -Continue levothyroxine  Recent diagnosis of UTI as an outpatient -Was on Macrobid as an outpatient.  Continue the same to finish course.  Microcytic anemia -Questionable cause.  Hemoglobin stable.  Outpatient follow-up.  Thrombocytosis -Most likely reactive.  Monitor intermittently    DVT prophylaxis: Lovenox Code Status: Full Family Communication: None at bedside Disposition Plan: Status is:  Observation The patient will require care spanning > 2 midnights and should be moved to inpatient because: Of severity of illness.  Will need SNF placement  Consultants: Case discussed by EDP with neurosurgery on-call on 07/17/2022  Procedures: None  Antimicrobials: Macrobid   Subjective: Patient seen and examined at bedside.  No overnight vomiting, worsening abdominal pain, fever, cough reported.  Still complains of intermittent back pain. Objective: Vitals:   07/18/22 2220 07/19/22 0138 07/19/22 0559 07/19/22 0647  BP: (!) 163/82 (!) 155/67 139/66   Pulse: 60 61 62   Resp: '17 17 16   '$ Temp: 98.2 F (36.8 C) 98.3 F (36.8 C) 98.2 F (36.8 C)   TempSrc: Oral Oral    SpO2: 100% 100% 98%   Weight:    70.6 kg  Height:        Intake/Output Summary (Last 24 hours) at 07/19/2022 0746 Last data filed at 07/19/2022 0646 Gross per 24 hour  Intake --  Output 650 ml  Net -650 ml   Filed Weights   07/17/22 1929 07/19/22 0647  Weight: 68 kg 70.6 kg    Examination:  General: On room air.  No distress.  respiratory: Decreased breath sounds at bases bilaterally with some crackles CVS: Currently rate controlled; S1-S2 heard  abdominal: Soft, nontender, slightly distended, no organomegaly;  bowel sounds are heard  extremities: Trace lower extremity edema; no clubbing.     Data Reviewed: I have personally reviewed following labs and imaging studies  CBC: Recent Labs  Lab 07/14/22 1800 07/17/22 2057 07/18/22 0500  WBC 6.9 6.4 5.8  NEUTROABS 5.9 3.9  --   HGB 9.6* 9.7* 8.8*  HCT 32.6* 34.3* 30.6*  MCV 66.8* 68.5* 68.8*  PLT 471* 520* 470*    Basic Metabolic Panel: Recent Labs  Lab 07/14/22 1800 07/17/22 2057 07/18/22 0500 07/19/22 0340  NA 139 144 144 143  K 3.7 3.6 3.4* 4.1  CL 104 110 112* 110  CO2 '27 27 25 24  '$ GLUCOSE 116* 114* 103* 104*  BUN '17 19 18 14  '$ CREATININE 1.09* 1.07* 0.83 0.73  CALCIUM 8.8* 8.9 8.5* 8.6*  MG  --   --   --  2.1    GFR: Estimated  Creatinine Clearance: 52.5 mL/min (by C-G formula based on SCr of 0.73 mg/dL). Liver Function Tests: Recent Labs  Lab 07/17/22 2057  AST 17  ALT 12  ALKPHOS 57  BILITOT 0.5  PROT 6.4*  ALBUMIN 3.3*    No results for input(s): "LIPASE", "AMYLASE" in the last 168 hours. No results for input(s): "AMMONIA" in the last 168 hours. Coagulation Profile: No results for input(s): "INR", "PROTIME" in the last 168 hours. Cardiac Enzymes: No results for input(s): "CKTOTAL", "CKMB", "CKMBINDEX", "TROPONINI" in the last 168 hours. BNP (last 3 results) No results for input(s): "PROBNP" in the last 8760 hours. HbA1C: No results for input(s): "HGBA1C" in the last 72 hours. CBG: No results for input(s): "GLUCAP" in the last 168 hours. Lipid Profile: No results for input(s): "CHOL", "HDL", "LDLCALC", "TRIG", "CHOLHDL", "LDLDIRECT" in the last 72 hours. Thyroid Function Tests: No results for input(s): "TSH", "T4TOTAL", "FREET4", "T3FREE", "THYROIDAB" in the last 72 hours. Anemia Panel: No results for input(s): "VITAMINB12", "FOLATE", "FERRITIN", "TIBC", "IRON", "RETICCTPCT" in the last 72 hours. Sepsis Labs: No results for input(s): "PROCALCITON", "LATICACIDVEN" in the last 168 hours.  No results found for this or any previous visit (from the past 240 hour(s)).       Radiology Studies: CT L-SPINE NO CHARGE  Result Date: 07/17/2022 CLINICAL DATA:  Initial evaluation for acute trauma, fall. EXAM: CT LUMBAR SPINE WITHOUT CONTRAST TECHNIQUE: Multidetector CT imaging of the lumbar spine was performed without intravenous contrast administration. Multiplanar CT image reconstructions were also generated. RADIATION DOSE REDUCTION: This exam was performed according to the departmental dose-optimization program which includes automated exposure control, adjustment of the mA and/or kV according to patient size and/or use of iterative reconstruction technique. COMPARISON:  Radiograph from 07/14/2022.  FINDINGS: Segmentation: Standard. Lowest well-formed disc space labeled the L5-S1 level. Alignment: 5 mm facet mediated anterolisthesis of L4 on L5. Underlying levoscoliosis. Vertebrae: Acute appearing compression fracture involving the superior endplate of L2 with mild 20% height loss and 3 mm bony retropulsion. Otherwise, vertebral body height maintained with no other acute or chronic fracture. Visualized sacrum and pelvis intact. SI joints symmetric and within normal limits. No worrisome osseous lesions. Paraspinal and other soft tissues: Mild edema seen adjacent to the acute L2 compression fracture. Paraspinous soft tissues demonstrate no other acute finding. Advanced aorto bi-iliac atherosclerotic disease. Few scattered simple renal cysts noted, benign in appearance, no follow-up imaging recommended. Right renal nephrolithiasis. Colonic diverticulosis noted. Disc levels: L1-2: 3 mm bony retropulsion related to the L2 compression fracture. Mild facet hypertrophy. No more than mild spinal stenosis. Foramina remain patent. L2-3: Hypertrophy diffuse disc bulge, asymmetric to the right. Mild facet hypertrophy. No significant spinal stenosis. Mild bilateral foraminal narrowing. L3-4: Mild disc bulge with superimposed right subarticular to foraminal disc protrusion (series 505, image 65). Mild facet hypertrophy. Resultant mild narrowing of the right lateral recess. Central canal remains patent. Mild right L3 foraminal stenosis. L4-5: Anterolisthesis. Associated broad posterior pseudo disc bulge/uncovering. Moderate to severe bilateral facet arthrosis, worse on the right. Resultant moderate to severe canal with bilateral subarticular  stenosis. Moderate right worse than left L4 foraminal narrowing. L5-S1: Disc bulge with intervertebral disc space narrowing and mild reactive endplate spurring. Moderate facet hypertrophy. No significant spinal stenosis. Moderate bilateral L5 foraminal narrowing. IMPRESSION: 1. Acute  compression fracture involving the superior endplate of L2 with mild 20% height loss and 3 mm bony retropulsion. No more than mild spinal stenosis at this level. 2. Multifactorial degenerative changes at L4-5 with resultant moderate to severe canal and bilateral subarticular stenosis, with moderate bilateral L4 foraminal narrowing. 3. Nonobstructive right renal nephrolithiasis. 4. Colonic diverticulosis. 5. Aortic Atherosclerosis (ICD10-I70.0). Electronically Signed   By: Jeannine Boga M.D.   On: 07/17/2022 23:02   CT Renal Stone Study  Result Date: 07/17/2022 CLINICAL DATA:  Flank pain after fall. EXAM: CT ABDOMEN AND PELVIS WITHOUT CONTRAST TECHNIQUE: Multidetector CT imaging of the abdomen and pelvis was performed following the standard protocol without IV contrast. RADIATION DOSE REDUCTION: This exam was performed according to the departmental dose-optimization program which includes automated exposure control, adjustment of the mA and/or kV according to patient size and/or use of iterative reconstruction technique. COMPARISON:  July 14, 2022. FINDINGS: Lower chest: No acute abnormality. Hepatobiliary: No focal liver abnormality is seen. No gallstones, gallbladder wall thickening, or biliary dilatation. Pancreas: Unremarkable. No pancreatic ductal dilatation or surrounding inflammatory changes. Spleen: Normal in size without focal abnormality. Adrenals/Urinary Tract: Adrenal glands appear normal. Probable small right renal calculus is noted. No hydronephrosis is noted. Bilateral parapelvic cysts are noted. Urinary bladder is unremarkable. Stomach/Bowel: Small sliding-type hiatal hernia. The stomach otherwise appears normal. There is no evidence of bowel obstruction or inflammation. Sigmoid diverticulosis is noted without inflammation. Status post appendectomy. Vascular/Lymphatic: Aortic atherosclerosis. No enlarged abdominal or pelvic lymph nodes. Reproductive: Status post hysterectomy. No adnexal  masses. Other: No abdominal wall hernia or abnormality. No abdominopelvic ascites. Musculoskeletal: Mild compression deformity of L2 vertebral body is again noted consistent with acute to subacute fracture. IMPRESSION: Probable small nonobstructive right renal calculus. No hydronephrosis or renal obstruction is noted. Sigmoid diverticulosis without inflammation. Mild compression deformity of L2 vertebral body is again noted consistent with acute to subacute fracture. Small sliding-type hiatal hernia. Aortic Atherosclerosis (ICD10-I70.0). Electronically Signed   By: Marijo Conception M.D.   On: 07/17/2022 21:27        Scheduled Meds:  aspirin EC  81 mg Oral QHS   enoxaparin (LOVENOX) injection  40 mg Subcutaneous Q24H   levothyroxine  88 mcg Oral Q0600   lidocaine  1 patch Transdermal Q24H   nitrofurantoin (macrocrystal-monohydrate)  100 mg Oral BID   sodium chloride flush  3 mL Intravenous Q12H   Continuous Infusions:        Aline August, MD [facility] 07/19/2022, 7:46 AM

## 2022-07-19 NOTE — Progress Notes (Signed)
[place] for report. Report given to [place].

## 2022-07-19 NOTE — TOC Transition Note (Signed)
Transition of Care [facility]) - CM/SW Discharge Note  Patient Details  Name: Cynthia Ho MRN: [id number] Date of Birth: 11-10-1936  Transition [facility] CM/SW Contact:  Sherie Don, LCSW Phone Number: 07/19/2022, 12:11 PM  Clinical Narrative: Family is requesting [facility] in admissions is able to make a bed offer for today. Insurance authorization completed on [employer] portal. Plan auth ID is: [id number]. Reference ID # is: [id number]. Patient has been approved for 07/19/2022-07/23/2022. Discharge summary, discharge orders, and SNF transfer report faxed to facility in hub.  Medical necessity form done; PTAR scheduled. Discharge packet completed. CSW notified daughter, Cynthia Ho, of discharge today and transportation being scheduled. RN updated. TOC signing off.  Final next level of care: Skilled Nursing Facility Barriers to Discharge: Barriers Resolved  Patient Goals and CMS Choice Patient states their goals for this hospitalization and ongoing recovery are:: Go to short-term rehab CMS Medicare.gov Compare Post Acute Care list provided to:: Patient Choice offered to / list presented to : Patient, Adult Children  Discharge Placement Existing PASRR number confirmed : 07/18/22          Patient chooses bed at: [facility] Patient to be transferred to facility by: [facility] Name of family member notified: Cynthia Ho (daughter) Patient and family notified of of transfer: 07/19/22  Discharge Plan and Services In-house Referral: Clinical Social Work Post Acute Care Choice: [facility]          DME Arranged: N/A DME Agency: NA  Readmission Risk Interventions     No data to display

## 2022-08-17 ENCOUNTER — Emergency Department (HOSPITAL_COMMUNITY): Payer: Medicare Other

## 2022-08-17 ENCOUNTER — Encounter (HOSPITAL_COMMUNITY): Payer: Self-pay

## 2022-08-17 ENCOUNTER — Emergency Department (HOSPITAL_COMMUNITY)
Admission: EM | Admit: 2022-08-17 | Discharge: 2022-08-18 | Disposition: A | Discharge status: Home / Self Care | Payer: Medicare Other | Attending: Emergency Medicine | Admitting: Emergency Medicine

## 2022-08-17 ENCOUNTER — Other Ambulatory Visit: Payer: Self-pay

## 2022-08-17 DIAGNOSIS — D649 Anemia, unspecified: Secondary | ICD-10-CM | POA: Diagnosis not present

## 2022-08-17 DIAGNOSIS — Z853 Personal history of malignant neoplasm of breast: Secondary | ICD-10-CM | POA: Insufficient documentation

## 2022-08-17 DIAGNOSIS — R531 Weakness: Secondary | ICD-10-CM | POA: Insufficient documentation

## 2022-08-17 DIAGNOSIS — R7989 Other specified abnormal findings of blood chemistry: Secondary | ICD-10-CM | POA: Diagnosis not present

## 2022-08-17 LAB — COMPREHENSIVE METABOLIC PANEL
ALT: 15 U/L (ref 0–44)
AST: 24 U/L (ref 15–41)
Albumin: 3.3 g/dL — ABNORMAL LOW (ref 3.5–5.0)
Alkaline Phosphatase: 75 U/L (ref 38–126)
Anion gap: 8 (ref 5–15)
BUN: 21 mg/dL (ref 8–23)
CO2: 24 mmol/L (ref 22–32)
Calcium: 9.1 mg/dL (ref 8.9–10.3)
Chloride: 112 mmol/L — ABNORMAL HIGH (ref 98–111)
Creatinine, Ser: 1.27 mg/dL — ABNORMAL HIGH (ref 0.44–1.00)
GFR, Estimated: 42 mL/min — ABNORMAL LOW (ref >60)
Glucose, Bld: 120 mg/dL — ABNORMAL HIGH (ref 70–99)
Potassium: 4.4 mmol/L (ref 3.5–5.1)
Sodium: 144 mmol/L (ref 135–145)
Total Bilirubin: 0.1 mg/dL — ABNORMAL LOW (ref 0.3–1.2)
Total Protein: 5.9 g/dL — ABNORMAL LOW (ref 6.5–8.1)

## 2022-08-17 LAB — CBC WITH DIFFERENTIAL/PLATELET
Abs Immature Granulocytes: 0.02 10*3/uL (ref 0.00–0.07)
Basophils Absolute: 0.1 10*3/uL (ref 0.0–0.1)
Basophils Relative: 1 %
Eosinophils Absolute: 0.2 10*3/uL (ref 0.0–0.5)
Eosinophils Relative: 2 %
HCT: 36.6 % (ref 36.0–46.0)
Hemoglobin: 10.8 g/dL — ABNORMAL LOW (ref 12.0–15.0)
Immature Granulocytes: 0 %
Lymphocytes Relative: 20 %
Lymphs Abs: 1.7 10*3/uL (ref 0.7–4.0)
MCH: 20.5 pg — ABNORMAL LOW (ref 26.0–34.0)
MCHC: 29.5 g/dL — ABNORMAL LOW (ref 30.0–36.0)
MCV: 69.3 fL — ABNORMAL LOW (ref 80.0–100.0)
Monocytes Absolute: 0.8 10*3/uL (ref 0.1–1.0)
Monocytes Relative: 10 %
Neutro Abs: 5.7 10*3/uL (ref 1.7–7.7)
Neutrophils Relative %: 67 %
Platelets: 472 10*3/uL — ABNORMAL HIGH (ref 150–400)
RBC: 5.28 MIL/uL — ABNORMAL HIGH (ref 3.87–5.11)
RDW: 22.3 % — ABNORMAL HIGH (ref 11.5–15.5)
WBC: 8.5 10*3/uL (ref 4.0–10.5)
nRBC: 0 % (ref 0.0–0.2)

## 2022-08-17 LAB — PROTIME-INR
INR: 1.1 (ref 0.8–1.2)
Prothrombin Time: 14.2 seconds (ref 11.4–15.2)

## 2022-08-17 MED ORDER — SODIUM CHLORIDE 0.9 % IV BOLUS
1000.0000 mL | Freq: Once | INTRAVENOUS | Status: AC
  Administered 2022-08-17: 1000 mL via INTRAVENOUS

## 2022-08-17 NOTE — ED Provider Triage Note (Signed)
Emergency Medicine Provider Triage Evaluation Note  Cynthia Ho , a 86 y.o. female  was evaluated in triage.  Pt complains of weakness and slurred speech.  Patient is a limited historian, information provided by EMS and daughter.  States that she has had increased weakness and tremor in her arms and legs over the past week.  Also states that she has had worsening slurred speech from her baseline by the daughter.  This has been over the past few days.  Review of Systems  Positive:  Negative:   Physical Exam  BP (!) 145/74   Pulse 78   Temp 98.3 F (36.8 C) (Oral)   Resp 18   SpO2 98%  Gen:   Awake, no distress   Resp:  Normal effort  MSK:   Moves extremities without difficulty  Other:  She does have tremor, no focal neurological findings.  Medical Decision Making  Medically screening exam initiated at 4:14 PM.  Appropriate orders placed.  Cynthia Ho was informed that the remainder of the evaluation will be completed by another provider, this initial triage assessment does not replace that evaluation, and the importance of remaining in the ED until their evaluation is complete.  Outside of window, will still obtain CT head at this time   Mickie Hillier, PA-C 08/17/22 1616

## 2022-08-17 NOTE — ED Notes (Signed)
Family just took pt to restroom. Was not given UA cup.

## 2022-08-17 NOTE — ED Provider Notes (Signed)
[facility] EMERGENCY DEPARTMENT Provider Note   CSN: [id number] Arrival date & time: 08/17/22  1555     History  Chief Complaint  Patient presents with   Weakness   weakness/tremors    Cynthia Ho is a 86 y.o. female.  With past medical history of stroke, breast cancer, lumbar fracture who presents to the ED via EMS for evaluation of lower extremity weakness and tremors for the past 2 days.  Patient was admitted for lumbar compression fracture on 07/17/2022 and was discharged to a rehab facility the next day.  She spent 2 weeks at this facility and reportedly did well.  Patient was then discharged to home where she lives with her daughter who is her primary caretaker.  She reports she also has home health nurses coming visit.  Daughter states that patient has not been able to move well since being discharged home, but this got significantly worse 2 days ago.  Daughter states that when patient stands up, her legs began shaking significantly, and she cannot walk.  Patient has only had breakfast the past 2 days including toast and milk.  Has been drinking less water and has had no other oral intake.  Patient reports no pain in her back when at rest, but reports pain a 10 out of 10 with movement.  Radiates down the posterior aspect of bilateral legs.  Denies fevers, chills, fecal incontinence, saddle paresthesias, chest pain, shortness of breath, abdominal pain, dysuria.  Patient has had longstanding urinary incontinence with no recent changes.   Weakness      Home Medications Prior to Admission medications   Medication Sig Start Date End Date Taking? Authorizing Provider  BAYER LOW DOSE 81 MG tablet Take 81 mg by mouth at bedtime. Swallow whole.    [provider]  calcium-vitamin D (OSCAL WITH D) 500-5 MG-MCG tablet Take 1 tablet by mouth 2 (two) times daily. 07/19/22   Aline August, MD  diclofenac Sodium (VOLTAREN) 1 % GEL Apply 4 g topically 4 (four) times  daily as needed. Patient taking differently: Apply 4 g topically 4 (four) times daily as needed (to affected areas- for pain). 07/14/22   Precious Gilding, DO  levothyroxine (SYNTHROID) 88 MCG tablet Take 88 mcg by mouth daily before breakfast.    [provider]  lidocaine (LIDODERM) 5 % Place 1 patch onto the skin daily. Remove & Discard patch within 12 hours or as directed by MD 07/20/22   Aline August, MD  oxyCODONE-acetaminophen (PERCOCET/ROXICET) 5-325 MG tablet Take 1 tablet by mouth every 6 (six) hours as needed for moderate pain or severe pain. 07/19/22   Aline August, MD  polyethylene glycol (MIRALAX / GLYCOLAX) 17 g packet Take 17 g by mouth daily as needed for mild constipation. 07/19/22   Aline August, MD  Prenat-Fe Carbonyl-FA-Omega 3 (ONE-A-DAY WOMENS PRENATAL 1 PO) Take 1 capsule by mouth daily with breakfast.    [provider]  senna (SENOKOT) 8.6 MG TABS tablet Take 1 tablet (8.6 mg total) by mouth 2 (two) times daily. 07/19/22   Aline August, MD      Allergies    Patient has no known allergies.    Review of Systems   Review of Systems  Musculoskeletal:  Positive for back pain.  Neurological:  Positive for weakness and numbness.  All other systems reviewed and are negative.   Physical Exam Updated Vital Signs BP 121/78   Pulse 76   Temp 98.1 F (36.7 C) (Oral)  Resp 16   SpO2 99%  Physical Exam Vitals and nursing note reviewed.  Constitutional:      General: She is not in acute distress.    Appearance: Normal appearance. She is well-developed. She is not ill-appearing.  HENT:     Head: Normocephalic and atraumatic.     Mouth/Throat:     Mouth: Mucous membranes are moist.     Pharynx: Oropharynx is clear. No oropharyngeal exudate or posterior oropharyngeal erythema.  Eyes:     Extraocular Movements: Extraocular movements intact.     Conjunctiva/sclera: Conjunctivae normal.     Pupils: Pupils are equal, round, and reactive to light.      Comments: No nystagmus  Cardiovascular:     Rate and Rhythm: Normal rate and regular rhythm.     Heart sounds: No murmur heard. Pulmonary:     Effort: Pulmonary effort is normal. No respiratory distress.     Breath sounds: Normal breath sounds.  Abdominal:     General: Abdomen is flat. Bowel sounds are normal.     Palpations: Abdomen is soft.     Tenderness: There is no abdominal tenderness.  Musculoskeletal:        General: No swelling.     Cervical back: Neck supple.  Skin:    General: Skin is warm and dry.     Capillary Refill: Capillary refill takes less than 2 seconds.  Neurological:     Mental Status: She is alert. Mental status is at baseline.     Sensory: No sensory deficit.     Motor: No weakness.     Comments: Oriented to self, location, others No pronator drift, no facial asymmetry, no slurred speech, no unilateral or global weakness  Psychiatric:        Mood and Affect: Mood normal.     ED Results / Procedures / Treatments   Labs (all labs ordered are listed, but only abnormal results are displayed) Labs Reviewed  COMPREHENSIVE METABOLIC PANEL - Abnormal; Notable for the following components:      Result Value   Chloride 112 (*)    Glucose, Bld 120 (*)    Creatinine, Ser 1.27 (*)    Total Protein 5.9 (*)    Albumin 3.3 (*)    Total Bilirubin 0.1 (*)    GFR, Estimated 42 (*)    All other components within normal limits  CBC WITH DIFFERENTIAL/PLATELET - Abnormal; Notable for the following components:   RBC 5.28 (*)    Hemoglobin 10.8 (*)    MCV 69.3 (*)    MCH 20.5 (*)    MCHC 29.5 (*)    RDW 22.3 (*)    Platelets 472 (*)    All other components within normal limits  PROTIME-INR  URINALYSIS, ROUTINE W REFLEX MICROSCOPIC  CBG MONITORING, ED    EKG EKG Interpretation  Date/Time:  Saturday August 17 2022 16:09:25 EDT Ventricular Rate:  74 PR Interval:  148 QRS Duration: 104 QT Interval:  422 QTC Calculation: 468 R Axis:   34 Text  Interpretation: Normal sinus rhythm with sinus arrhythmia Minimal voltage criteria for LVH, may be normal variant ( Cornell product ) Septal infarct , age undetermined Abnormal ECG When compared with ECG of 04-May-2022 07:55, PREVIOUS ECG IS PRESENT Confirmed by Noemi Chapel [phone]) on 08/17/2022 8:47:19 PM  Radiology CT Head Wo Contrast  Result Date: 08/17/2022 CLINICAL DATA:  Mental status change. EXAM: CT HEAD WITHOUT CONTRAST TECHNIQUE: Contiguous axial images were obtained from the base of the  skull through the vertex without intravenous contrast. RADIATION DOSE REDUCTION: This exam was performed according to the departmental dose-optimization program which includes automated exposure control, adjustment of the mA and/or kV according to patient size and/or use of iterative reconstruction technique. COMPARISON:  May 28, 2022 CT of the brain FINDINGS: Brain: No subdural, epidural, or subarachnoid hemorrhage. Ventricles and sulci are prominent but stable. Cerebellum, brainstem, and basal cisterns are normal. A lacunar infarct is identified in the anterior right thalamus. Mild white matter changes are noted. No acute cortical ischemia or interval infarct. No mass effect or midline shift. Vascular: Calcified atherosclerotic change in the intracranial carotids. Skull: Normal. Negative for fracture or focal lesion. Sinuses/Orbits: No acute finding. Other: None. IMPRESSION: Chronic white matter changes and right thalamic lacunar infarct. No acute intracranial abnormalities to explain the patient's mental status changes. Electronically Signed   By: Dorise Bullion III M.D.   On: 08/17/2022 17:27    Procedures Procedures    Medications Ordered in ED Medications  sodium chloride 0.9 % bolus 1,000 mL (has no administration in time range)    ED Course/ Medical Decision Making/ A&P Clinical Course as of 08/17/22 2335  Sat Aug 17, 2022  2215 CT Head Wo Contrast I personally reviewed and interpreted the  image.  No acute intracranial abnormality, specifically no new infarct or obvious bleeding [AS]    Clinical Course User Index [AS] Samyukta Cura, Grafton Folk, PA-C    This patient presents to the ED for concern of lower extremity weakness, this involves an extensive number of treatment options, and is a complaint that carries with it a high risk of complications and morbidity. The differential diagnosis of weakness includes but is not limited to neurologic causes (GBS, myasthenia gravis, CVA, MS, ALS, transverse myelitis, spinal cord injury, CVA, botulism, ) and other causes: ACS, Arrhythmia, syncope, orthostatic hypotension, sepsis, hypoglycemia, electrolyte disturbance, hypothyroidism, respiratory failure, symptomatic anemia, dehydration, heat injury, polypharmacy, malignancy.    Co morbidities that complicate the patient evaluation  Recent lumbar compression fracture, CVA  My initial workup includes basic labs, CT head without contrast to assess for intracranial abnormalities, MRI lumbar spine to assess for cord compression.  Additional history obtained from: Nursing notes from this visit. Prior ED visit on 07/17/2022 for evaluation of lumbar compression fracture Family 2 daughters at bedside and contribute to a portion of the history EMS who brought the patient in contribute to a portion of the history  I ordered, reviewed and interpreted labs which include: CBC, CMP, INR, urinalysis.  CMP shows creatinine 1.27, elevated from baseline of 0.8.  CBC shows hemoglobin 10.8, at baseline   I ordered imaging studies including CT head without contrast, MRI lumbar spine I independently visualized and interpreted imaging which showed CT head showed no acute intracranial abnormality.  MRI lumbar spine completed at the time of handoff. I agree with the radiologist interpretation  Cardiac Monitoring:  The patient was maintained on a cardiac monitor.  I personally viewed and interpreted the cardiac  monitored which showed an underlying rhythm of: NSR  Workup pending at the time of handoff. Patient will need a social work consult due to daughters unable to take care of patient at home, order was placed for transition of care team but has not called back. Care will be handed off to Dr. Betsey Holiday. Patient in stable condition at handoff.  Note: Portions of this report may have been transcribed using voice recognition software. Every effort was made to ensure accuracy; however, inadvertent computerized  transcription errors may still be present.                        Medical Decision Making Amount and/or Complexity of Data Reviewed Radiology: ordered.          Final Clinical Impression(s) / ED Diagnoses Final diagnoses:  None    Rx / DC Orders ED Discharge Orders     None         Nehemiah Massed 08/17/22 Darci Needle    Noemi Chapel, MD 08/18/22 2125

## 2022-08-17 NOTE — ED Triage Notes (Signed)
Patient from home BIB [facility] for increased weakness and tremor in arms and legs. Patient normally has tremors however they have increased over the last couple of days, along with slurred speech by the daughter.  VSS. CBG 131, BP 142/84 HR 82.

## 2022-08-18 LAB — URINALYSIS, ROUTINE W REFLEX MICROSCOPIC
Bilirubin Urine: NEGATIVE
Glucose, UA: NEGATIVE mg/dL
Hgb urine dipstick: NEGATIVE
Ketones, ur: NEGATIVE mg/dL
Leukocytes,Ua: NEGATIVE
Nitrite: NEGATIVE
Protein, ur: NEGATIVE mg/dL
Specific Gravity, Urine: 1.018 (ref 1.005–1.030)
pH: 5 (ref 5.0–8.0)

## 2022-08-18 MED ORDER — OXYCODONE-ACETAMINOPHEN 5-325 MG PO TABS
1.0000 | ORAL_TABLET | Freq: Four times a day (QID) | ORAL | Status: DC | PRN
  Administered 2022-08-18: 1 via ORAL
  Filled 2022-08-18: qty 1

## 2022-08-18 NOTE — ED Notes (Signed)
Pt's daughter came to this nurse earlier (around noon) asking for D/C, pt no longer wanting to stay and daughter willing to take home. Pt still under ED care d/t waiting for SNF placement and not being admitted to hosp. This nurse got in touch with ED MD Dr. Francia Greaves who came to re--eval pt for D/C. D/C orders in. Dicharge instructions reviewed and education provided. All questions answered. Pt and daughter verbalized understanding. Pt left unit in wheelchair with all belongings in stable condition

## 2022-08-18 NOTE — ED Provider Notes (Signed)
Seen at request of nursing staff.  Patient and patient's daughter are desiring discharge.  They feel comfortable going home.  They decline to wait here in the ED for SNF placement.  Daughter is reporting to this provider that she is figured out how to get more help at home and she is quite comfortable with taking her mother home.  The patient does not want to stay here in the ED waiting for potential placement.  She is desiring discharge as well.  Both patient and patient's family member understand need for close outpatient follow-up.  They are advised that if they need to they can return to the ED for repeat evaluation.   Valarie Merino, MD 08/18/22 [phone]

## 2022-08-18 NOTE — Progress Notes (Signed)
PT Cancellation Note  Patient Details Name: Cynthia Ho MRN: [id number] DOB: 1936-03-02   Cancelled Treatment:    Reason Eval/Treat Not Completed: Patient declined, no reason specified. Upon PT arrival pt and pt's daughter report they are preparing to leave the emergency department. Pt also without back brace at this time, which she reports she has been told to utilize at all times when out of bed, limiting PT's ability to fully evaluate pt mobility. Pt and daughter express no concerns about discharge home and verify that they have necessary DME within the home. PT will continue to follow until discharge is complete. If pt does need to be evaluated prior to leaving she will need lumbar corset present.   Zenaida Niece 08/18/2022, 1:19 PM

## 2022-08-18 NOTE — ED Notes (Signed)
Pt had asked for breakfast, no diet order was in. This nurse asked MD for diet order and ordered meal tray from kitchen. Breakfsat tray just delivered

## 2022-08-18 NOTE — Discharge Instructions (Signed)
Return for any problem.  ?

## 2023-07-29 IMAGING — DX DG CHEST 1V PORT
1 series · 1 of 1 positions shown · non-contrast
Comparison: 12/05/2019

CLINICAL DATA: Shortness of breath

EXAM:
PORTABLE CHEST 1 VIEW

[chest ap]
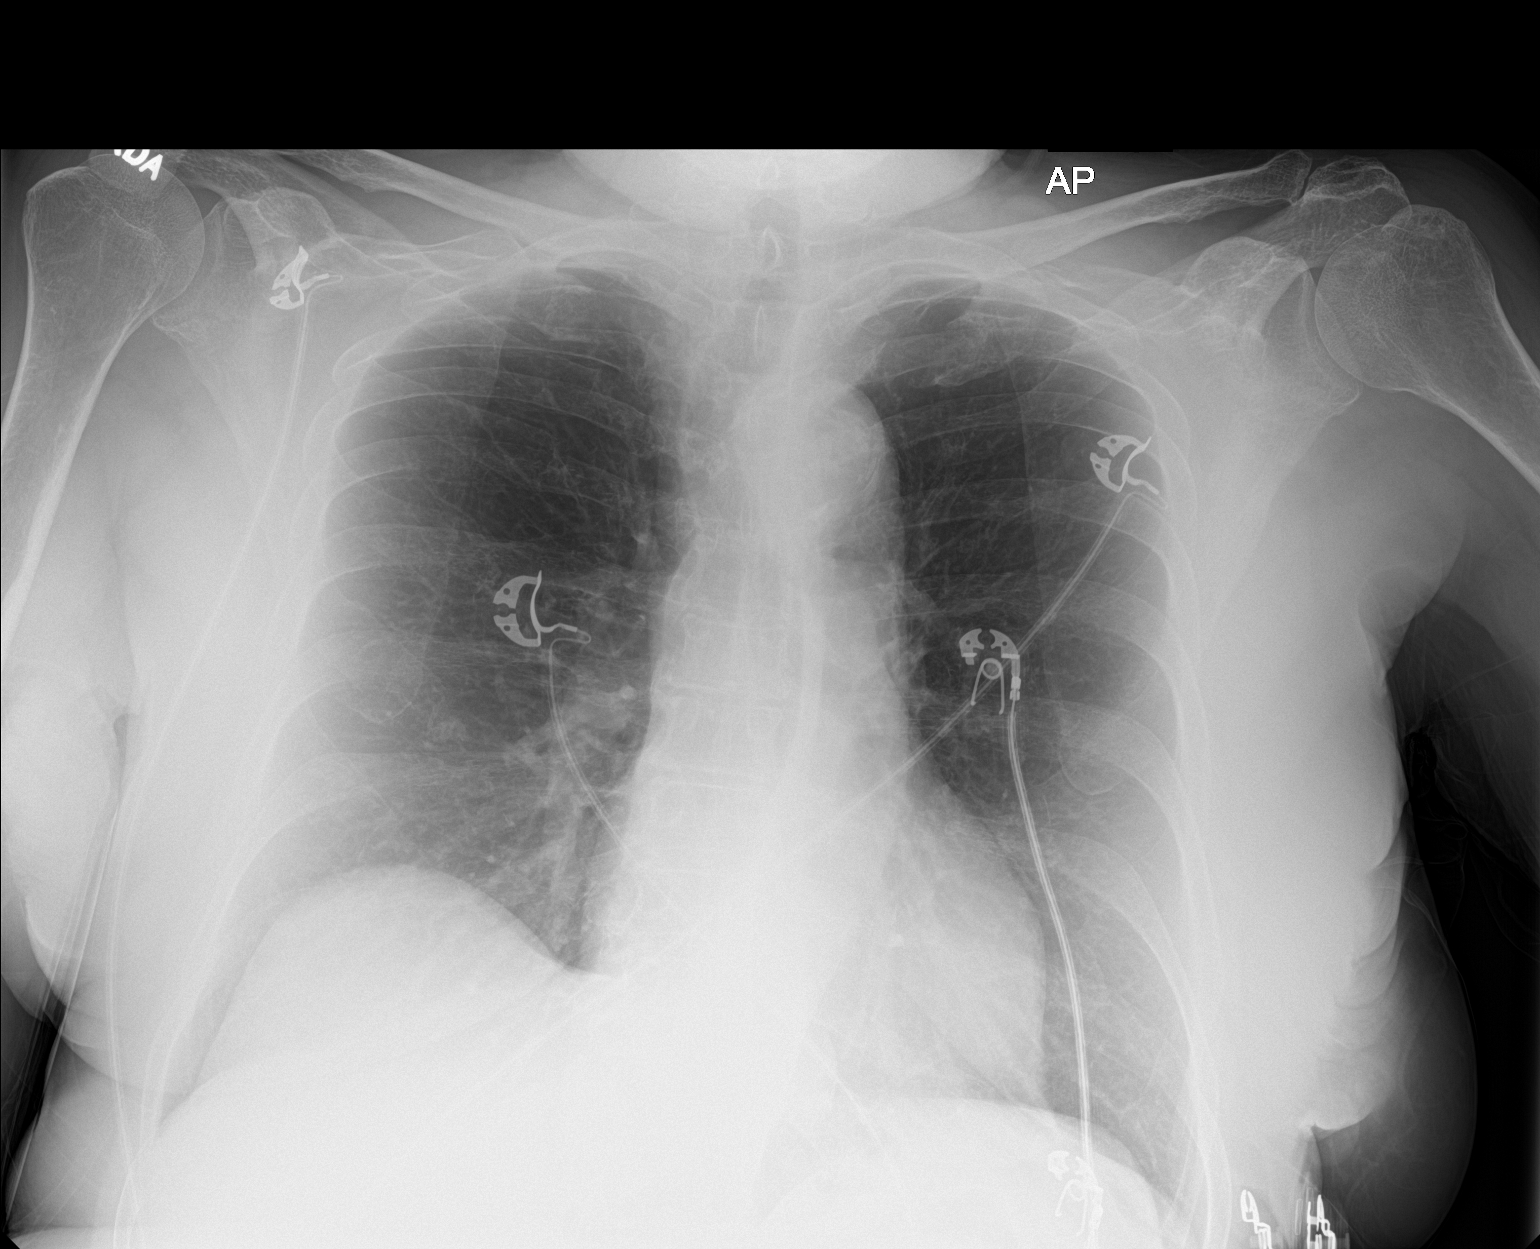

[1 of 1 positions shown; findings below may reference images not displayed]

FINDINGS: Eventration of the right diaphragm. There is no edema,
consolidation, effusion, or pneumothorax. Normal heart size and
mediastinal contours.
IMPRESSION: No active disease.

## 2023-07-29 IMAGING — US US EXTREM LOW VENOUS
1 series · 14 of 24 positions shown · non-contrast
Comparison: None Available.

CLINICAL DATA: Bilateral leg swelling for 2 weeks.

EXAM:
BILATERAL LOWER EXTREMITY VENOUS DOPPLER ULTRASOUND
TECHNIQUE: Gray-scale sonography with compression, as well as color and duplex
ultrasound, were performed to evaluate the deep venous system(s)
from the level of the common femoral vein through the popliteal and
proximal calf veins.

[Series 1: us extrem low venous · 14 of 69 slices shown]
[im 1/69]
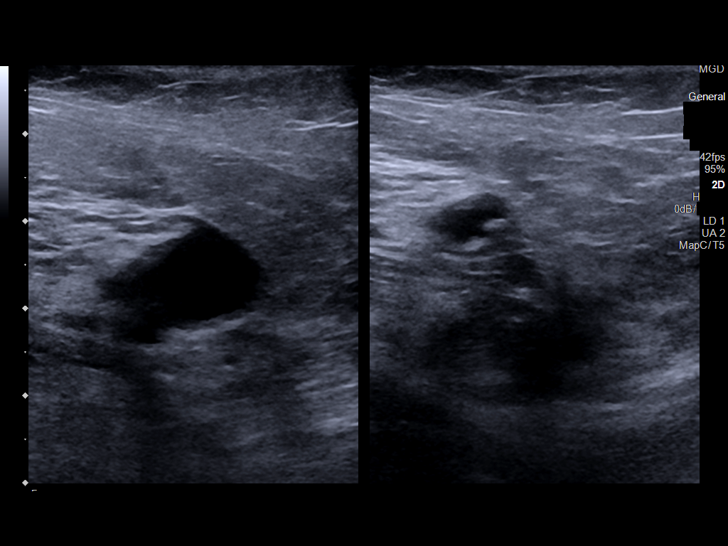
[im 6/69]
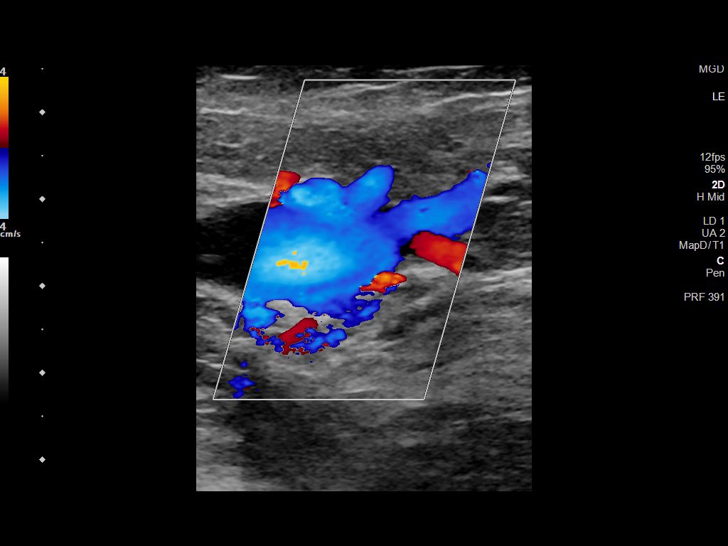
[im 12/69]
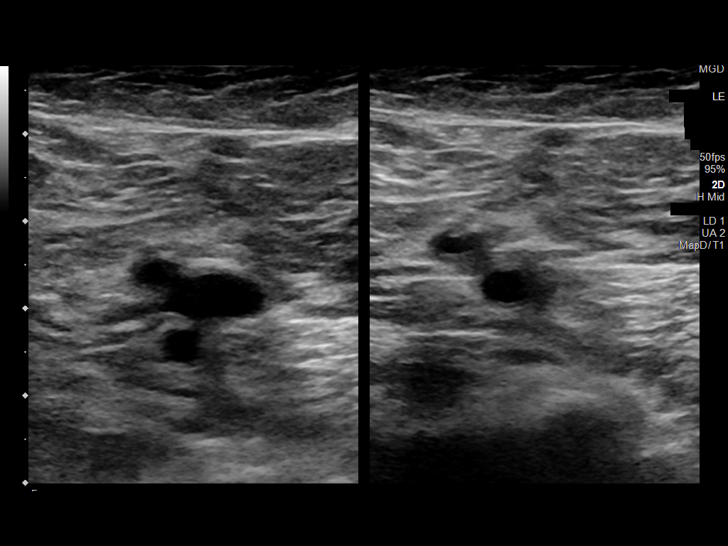
[im 18/69]
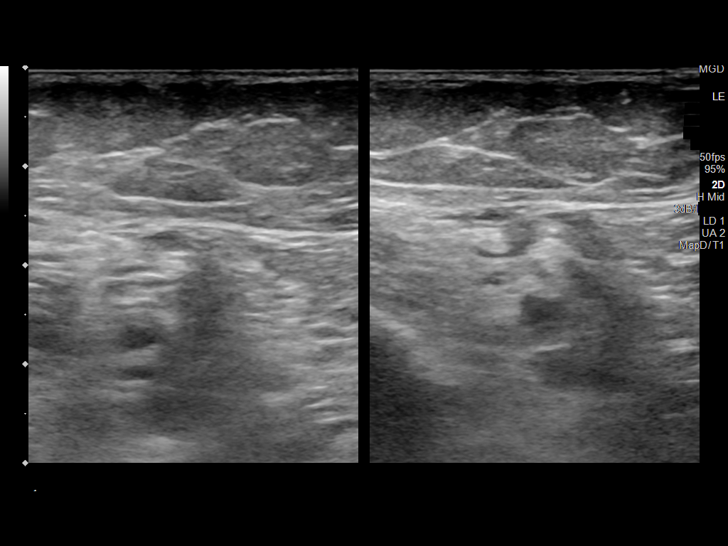
[im 21/69]
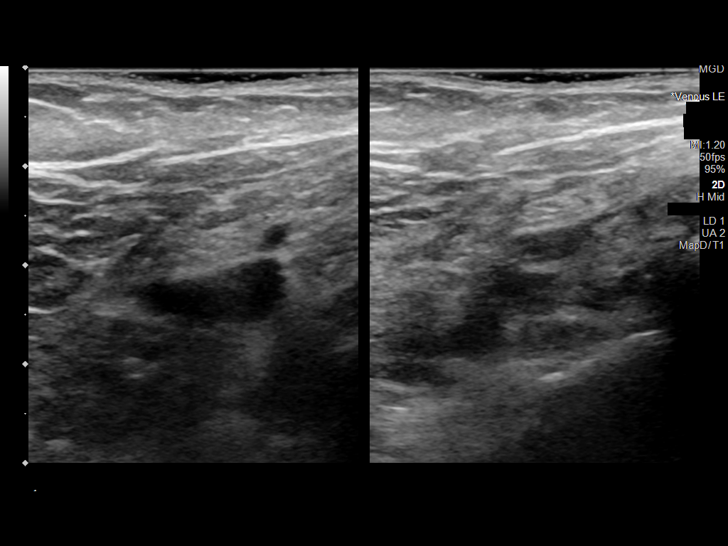
[im 27/69]
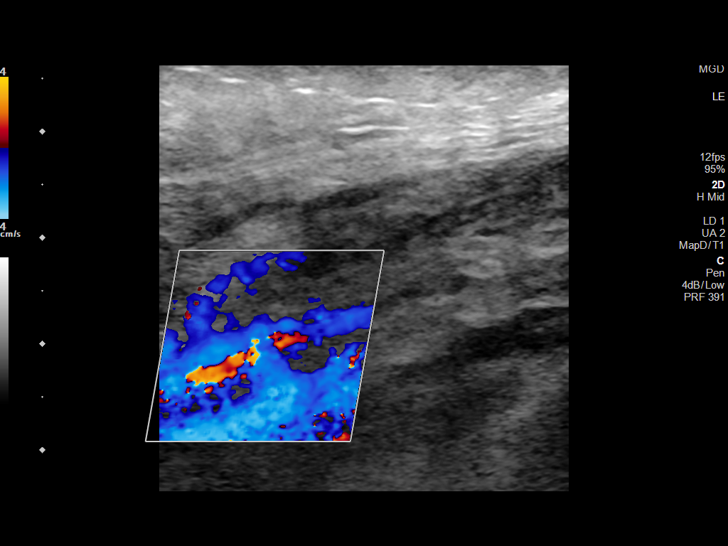
[im 33/69]
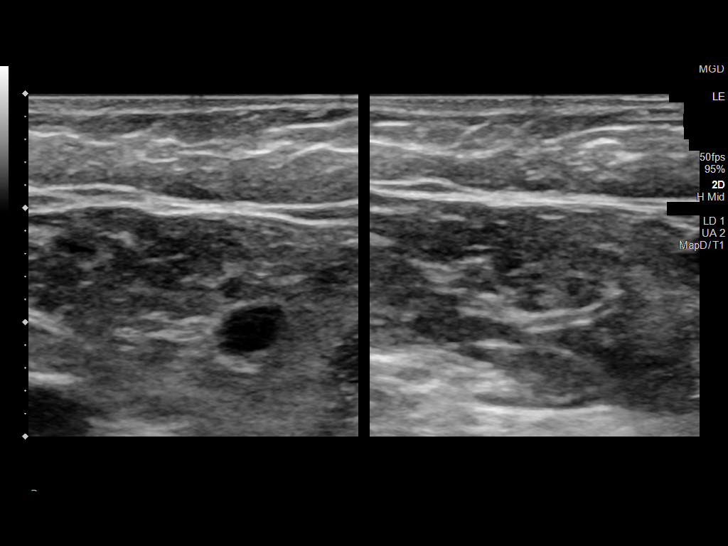
[im 36/69]
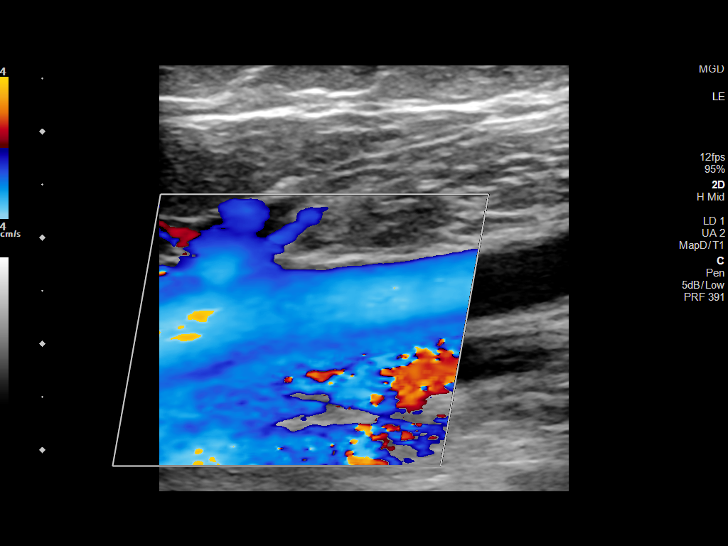
[im 42/69]
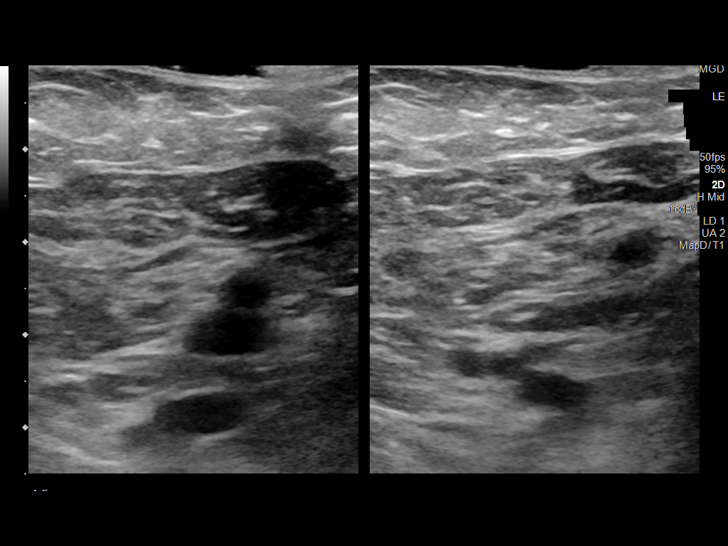
[im 48/69]
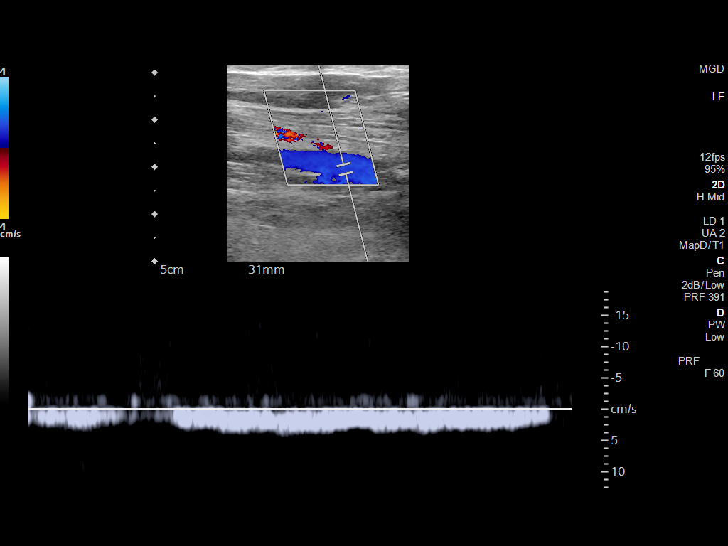
[im 54/69]
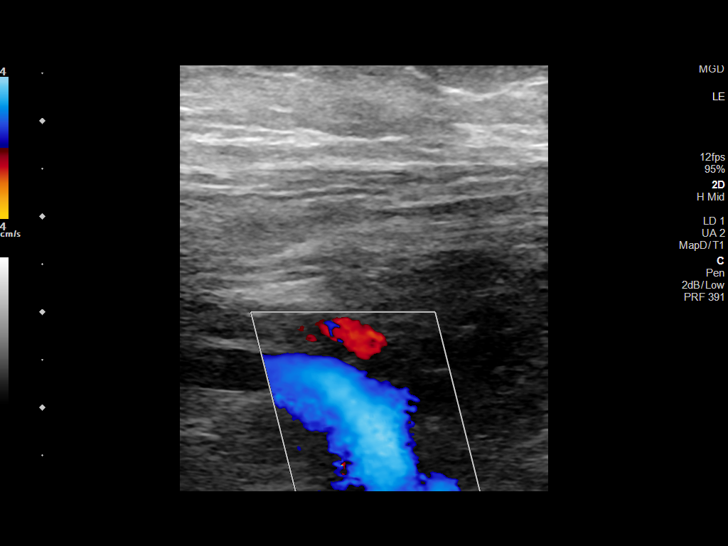
[im 57/69]
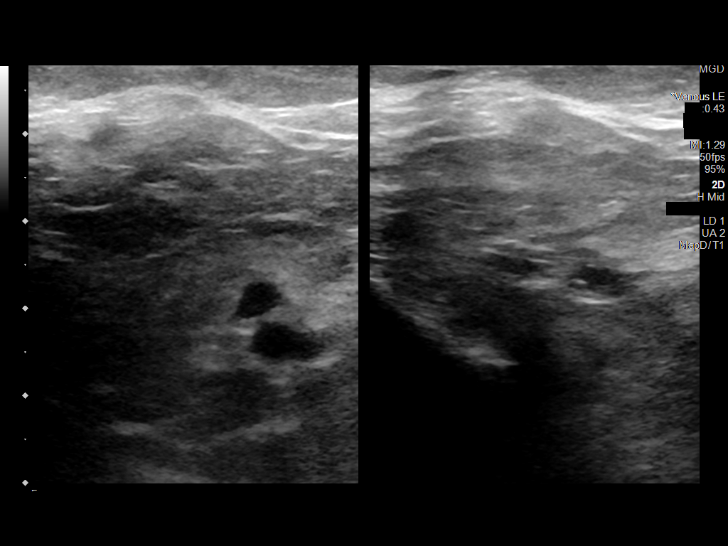
[im 63/69]
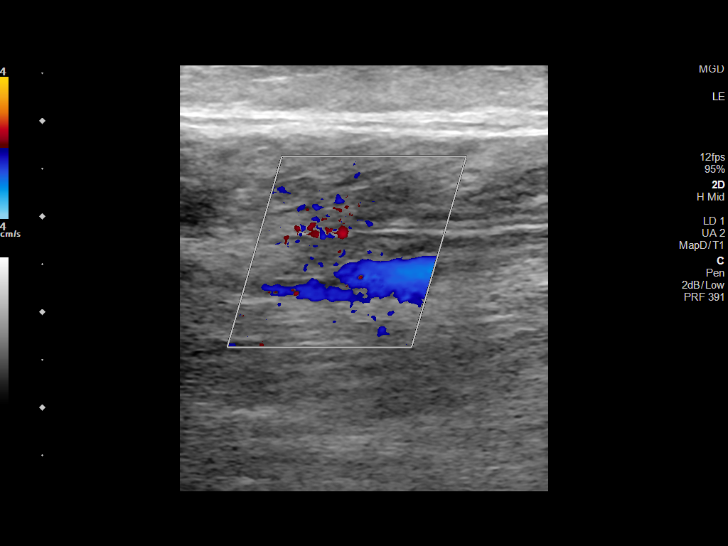
[im 69/69]
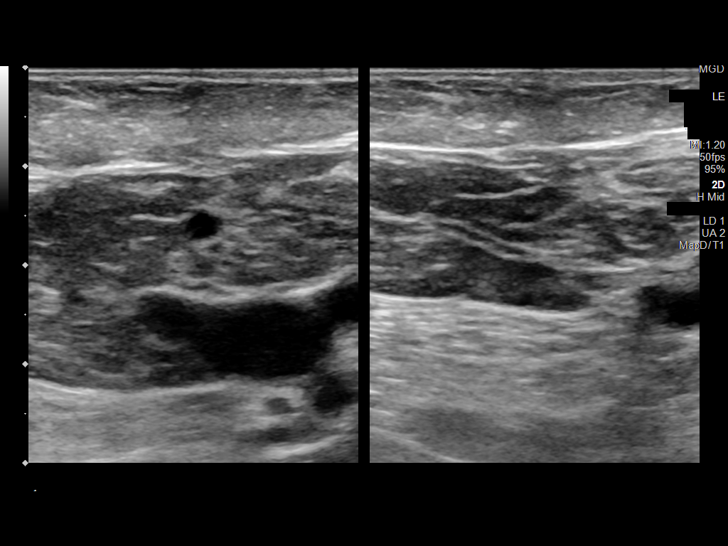

[14 of 24 positions shown; findings below may reference images not displayed]

FINDINGS: VENOUS

Normal compressibility of the common femoral, superficial femoral,
and popliteal veins, as well as the visualized calf veins.
Visualized portions of profunda femoral vein and great saphenous
vein unremarkable. No filling defects to suggest DVT on grayscale or
color Doppler imaging. Doppler waveforms show normal direction of
venous flow, normal respiratory plasticity and response to
augmentation.

Limited views of the contralateral common femoral vein are
unremarkable.

OTHER

None.

Limitations: none
IMPRESSION: Negative.

## 2024-10-24 ENCOUNTER — Other Ambulatory Visit (HOSPITAL_BASED_OUTPATIENT_CLINIC_OR_DEPARTMENT_OTHER): Payer: Self-pay | Admitting: Internal Medicine

## 2024-10-24 DIAGNOSIS — Z13820 Encounter for screening for osteoporosis: Secondary | ICD-10-CM
